# Patient Record
Sex: Female | Born: 1955 | Race: White | Hispanic: No | Marital: Married | Smoking: Never smoker
Health system: Southern US, Community
[De-identification: ages and names within clinical notes are randomized; demographics above are authoritative.]

## PROBLEM LIST (undated history)

## (undated) DIAGNOSIS — E119 Type 2 diabetes mellitus without complications: Secondary | ICD-10-CM

## (undated) DIAGNOSIS — I10 Essential (primary) hypertension: Secondary | ICD-10-CM

## (undated) DIAGNOSIS — I4891 Unspecified atrial fibrillation: Secondary | ICD-10-CM

## (undated) DIAGNOSIS — R001 Bradycardia, unspecified: Secondary | ICD-10-CM

## (undated) HISTORY — DX: Bradycardia, unspecified: R00.1

## (undated) HISTORY — DX: Type 2 diabetes mellitus without complications: E11.9

---

## 2020-09-06 ENCOUNTER — Encounter: Payer: Self-pay | Admitting: Emergency Medicine

## 2020-09-06 ENCOUNTER — Emergency Department
Admission: EM | Admit: 2020-09-06 | Discharge: 2020-09-06 | Disposition: A | Discharge status: Home / Self Care | Payer: No Typology Code available for payment source | Attending: Emergency Medicine | Admitting: Emergency Medicine

## 2020-09-06 ENCOUNTER — Emergency Department: Payer: No Typology Code available for payment source

## 2020-09-06 ENCOUNTER — Other Ambulatory Visit: Payer: Self-pay

## 2020-09-06 DIAGNOSIS — R079 Chest pain, unspecified: Secondary | ICD-10-CM | POA: Insufficient documentation

## 2020-09-06 DIAGNOSIS — M542 Cervicalgia: Secondary | ICD-10-CM | POA: Insufficient documentation

## 2020-09-06 DIAGNOSIS — S0990XA Unspecified injury of head, initial encounter: Secondary | ICD-10-CM | POA: Diagnosis present

## 2020-09-06 DIAGNOSIS — I700 Atherosclerosis of aorta: Secondary | ICD-10-CM | POA: Diagnosis not present

## 2020-09-06 DIAGNOSIS — M7531 Calcific tendinitis of right shoulder: Secondary | ICD-10-CM | POA: Diagnosis not present

## 2020-09-06 DIAGNOSIS — I10 Essential (primary) hypertension: Secondary | ICD-10-CM | POA: Insufficient documentation

## 2020-09-06 DIAGNOSIS — M25512 Pain in left shoulder: Secondary | ICD-10-CM | POA: Diagnosis present

## 2020-09-06 DIAGNOSIS — R739 Hyperglycemia, unspecified: Secondary | ICD-10-CM | POA: Insufficient documentation

## 2020-09-06 DIAGNOSIS — S3991XA Unspecified injury of abdomen, initial encounter: Secondary | ICD-10-CM | POA: Diagnosis not present

## 2020-09-06 DIAGNOSIS — Y92410 Unspecified street and highway as the place of occurrence of the external cause: Secondary | ICD-10-CM | POA: Insufficient documentation

## 2020-09-06 HISTORY — DX: Unspecified atrial fibrillation: I48.91

## 2020-09-06 HISTORY — DX: Essential (primary) hypertension: I10

## 2020-09-06 LAB — CBC
HCT: 41.6 % (ref 36.0–46.0)
Hemoglobin: 13.5 g/dL (ref 12.0–15.0)
MCH: 28.7 pg (ref 26.0–34.0)
MCHC: 32.5 g/dL (ref 30.0–36.0)
MCV: 88.5 fL (ref 80.0–100.0)
Platelets: 340 10*3/uL (ref 150–400)
RBC: 4.7 MIL/uL (ref 3.87–5.11)
RDW: 13.4 % (ref 11.5–15.5)
WBC: 10.6 10*3/uL — ABNORMAL HIGH (ref 4.0–10.5)
nRBC: 0 % (ref 0.0–0.2)

## 2020-09-06 LAB — BASIC METABOLIC PANEL
Anion gap: 11 (ref 5–15)
BUN: 25 mg/dL — ABNORMAL HIGH (ref 8–23)
CO2: 23 mmol/L (ref 22–32)
Calcium: 9.3 mg/dL (ref 8.9–10.3)
Chloride: 107 mmol/L (ref 98–111)
Creatinine, Ser: 1.08 mg/dL — ABNORMAL HIGH (ref 0.44–1.00)
GFR, Estimated: 57 mL/min — ABNORMAL LOW (ref >60)
Glucose, Bld: 172 mg/dL — ABNORMAL HIGH (ref 70–99)
Potassium: 3.7 mmol/L (ref 3.5–5.1)
Sodium: 141 mmol/L (ref 135–145)

## 2020-09-06 MED ORDER — METHOCARBAMOL 500 MG PO TABS
500.0000 mg | ORAL_TABLET | Freq: Three times a day (TID) | ORAL | 0 refills | Status: AC | PRN
Start: 2020-09-06 — End: ?

## 2020-09-06 MED ORDER — METHOCARBAMOL 500 MG PO TABS: 500.0000 mg | ORAL_TABLET | Freq: Three times a day (TID) | ORAL | 0 refills | Status: DC | PRN

## 2020-09-06 MED ORDER — IOHEXOL 300 MG/ML  SOLN
100.0000 mL | Freq: Once | INTRAMUSCULAR | Status: AC | PRN
  Administered 2020-09-06: 18:00:00 100 mL via INTRAVENOUS
  Filled 2020-09-06: qty 100

## 2020-09-06 MED ORDER — ACETAMINOPHEN 325 MG PO TABS
650.0000 mg | ORAL_TABLET | Freq: Once | ORAL | Status: AC
  Administered 2020-09-06: 19:00:00 650 mg via ORAL
  Filled 2020-09-06: qty 2

## 2020-09-06 MED ORDER — ACETAMINOPHEN 500 MG PO TABS: 500.0000 mg | ORAL_TABLET | Freq: Four times a day (QID) | ORAL | 0 refills | Status: DC | PRN

## 2020-09-06 MED ORDER — LIDOCAINE 5 % EX PTCH
1.0000 | MEDICATED_PATCH | CUTANEOUS | Status: DC
  Administered 2020-09-06: 1 via TRANSDERMAL

## 2020-09-06 MED ORDER — ACETAMINOPHEN 500 MG PO TABS: 500.0000 mg | ORAL_TABLET | Freq: Four times a day (QID) | ORAL | 0 refills | Status: AC | PRN
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
## 2020-09-06 NOTE — ED Notes (Signed)
Pt taken by xray 
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
## 2020-09-06 NOTE — ED Notes (Signed)
Skin on abdomen now appears blotchy/pink.

## 2020-09-15 DIAGNOSIS — R0600 Dyspnea, unspecified: Secondary | ICD-10-CM | POA: Diagnosis not present

## 2020-10-02 DIAGNOSIS — R0683 Snoring: Secondary | ICD-10-CM | POA: Diagnosis not present

## 2020-10-02 DIAGNOSIS — R0681 Apnea, not elsewhere classified: Secondary | ICD-10-CM | POA: Diagnosis not present

## 2020-10-02 DIAGNOSIS — E039 Hypothyroidism, unspecified: Secondary | ICD-10-CM | POA: Diagnosis not present

## 2020-10-02 DIAGNOSIS — R7303 Prediabetes: Secondary | ICD-10-CM | POA: Diagnosis not present

## 2020-10-02 DIAGNOSIS — Z780 Asymptomatic menopausal state: Secondary | ICD-10-CM | POA: Diagnosis not present

## 2020-10-02 DIAGNOSIS — I10 Essential (primary) hypertension: Secondary | ICD-10-CM | POA: Diagnosis not present

## 2020-10-02 DIAGNOSIS — Z8669 Personal history of other diseases of the nervous system and sense organs: Secondary | ICD-10-CM | POA: Diagnosis not present

## 2020-10-02 DIAGNOSIS — R739 Hyperglycemia, unspecified: Secondary | ICD-10-CM | POA: Diagnosis not present

## 2020-10-02 DIAGNOSIS — Z1211 Encounter for screening for malignant neoplasm of colon: Secondary | ICD-10-CM | POA: Diagnosis not present

## 2020-10-02 DIAGNOSIS — I480 Paroxysmal atrial fibrillation: Secondary | ICD-10-CM | POA: Diagnosis not present

## 2020-10-02 DIAGNOSIS — E538 Deficiency of other specified B group vitamins: Secondary | ICD-10-CM | POA: Diagnosis not present

## 2020-10-02 DIAGNOSIS — E559 Vitamin D deficiency, unspecified: Secondary | ICD-10-CM | POA: Diagnosis not present

## 2020-10-07 ENCOUNTER — Other Ambulatory Visit (HOSPITAL_COMMUNITY): Payer: Self-pay | Admitting: Specialist

## 2020-10-07 ENCOUNTER — Other Ambulatory Visit: Payer: Self-pay | Admitting: Specialist

## 2020-10-07 DIAGNOSIS — M1711 Unilateral primary osteoarthritis, right knee: Secondary | ICD-10-CM | POA: Diagnosis not present

## 2020-10-07 DIAGNOSIS — M7542 Impingement syndrome of left shoulder: Secondary | ICD-10-CM | POA: Diagnosis not present

## 2020-10-07 DIAGNOSIS — M542 Cervicalgia: Secondary | ICD-10-CM | POA: Diagnosis not present

## 2020-10-07 DIAGNOSIS — G4733 Obstructive sleep apnea (adult) (pediatric): Secondary | ICD-10-CM | POA: Diagnosis not present

## 2020-10-08 ENCOUNTER — Other Ambulatory Visit: Payer: Self-pay | Admitting: Specialist

## 2020-10-08 DIAGNOSIS — R0989 Other specified symptoms and signs involving the circulatory and respiratory systems: Secondary | ICD-10-CM

## 2020-10-08 DIAGNOSIS — M79604 Pain in right leg: Secondary | ICD-10-CM

## 2020-10-14 DIAGNOSIS — M25512 Pain in left shoulder: Secondary | ICD-10-CM | POA: Diagnosis not present

## 2020-10-14 DIAGNOSIS — M25561 Pain in right knee: Secondary | ICD-10-CM | POA: Diagnosis not present

## 2020-10-14 DIAGNOSIS — M542 Cervicalgia: Secondary | ICD-10-CM | POA: Diagnosis not present

## 2020-10-15 ENCOUNTER — Other Ambulatory Visit: Payer: Self-pay

## 2020-10-15 ENCOUNTER — Ambulatory Visit
Admission: RE | Admit: 2020-10-15 | Discharge: 2020-10-15 | Disposition: A | Discharge status: Home / Self Care | Payer: PPO | Source: Ambulatory Visit | Attending: Specialist | Admitting: Specialist

## 2020-10-15 DIAGNOSIS — M79605 Pain in left leg: Secondary | ICD-10-CM | POA: Insufficient documentation

## 2020-10-15 DIAGNOSIS — R0989 Other specified symptoms and signs involving the circulatory and respiratory systems: Secondary | ICD-10-CM | POA: Insufficient documentation

## 2020-10-15 DIAGNOSIS — M79604 Pain in right leg: Secondary | ICD-10-CM | POA: Diagnosis not present

## 2020-10-16 DIAGNOSIS — E039 Hypothyroidism, unspecified: Secondary | ICD-10-CM | POA: Diagnosis not present

## 2020-10-16 DIAGNOSIS — E538 Deficiency of other specified B group vitamins: Secondary | ICD-10-CM | POA: Diagnosis not present

## 2020-10-16 DIAGNOSIS — E559 Vitamin D deficiency, unspecified: Secondary | ICD-10-CM | POA: Diagnosis not present

## 2020-10-20 DIAGNOSIS — M542 Cervicalgia: Secondary | ICD-10-CM | POA: Diagnosis not present

## 2020-10-20 DIAGNOSIS — M818 Other osteoporosis without current pathological fracture: Secondary | ICD-10-CM | POA: Diagnosis not present

## 2020-10-31 DIAGNOSIS — M25512 Pain in left shoulder: Secondary | ICD-10-CM | POA: Diagnosis not present

## 2020-10-31 DIAGNOSIS — M542 Cervicalgia: Secondary | ICD-10-CM | POA: Diagnosis not present

## 2020-10-31 DIAGNOSIS — M25561 Pain in right knee: Secondary | ICD-10-CM | POA: Diagnosis not present

## 2020-11-03 DIAGNOSIS — R7303 Prediabetes: Secondary | ICD-10-CM | POA: Diagnosis not present

## 2020-11-03 DIAGNOSIS — I10 Essential (primary) hypertension: Secondary | ICD-10-CM | POA: Diagnosis not present

## 2020-11-03 DIAGNOSIS — I480 Paroxysmal atrial fibrillation: Secondary | ICD-10-CM | POA: Diagnosis not present

## 2020-11-03 DIAGNOSIS — E039 Hypothyroidism, unspecified: Secondary | ICD-10-CM | POA: Diagnosis not present

## 2020-11-05 DIAGNOSIS — M25512 Pain in left shoulder: Secondary | ICD-10-CM | POA: Diagnosis not present

## 2020-11-05 DIAGNOSIS — M542 Cervicalgia: Secondary | ICD-10-CM | POA: Diagnosis not present

## 2020-11-05 DIAGNOSIS — M25561 Pain in right knee: Secondary | ICD-10-CM | POA: Diagnosis not present

## 2020-11-12 DIAGNOSIS — M542 Cervicalgia: Secondary | ICD-10-CM | POA: Diagnosis not present

## 2020-11-12 DIAGNOSIS — M25561 Pain in right knee: Secondary | ICD-10-CM | POA: Diagnosis not present

## 2020-11-12 DIAGNOSIS — M25512 Pain in left shoulder: Secondary | ICD-10-CM | POA: Diagnosis not present

## 2020-11-14 DIAGNOSIS — H5789 Other specified disorders of eye and adnexa: Secondary | ICD-10-CM | POA: Diagnosis not present

## 2020-11-14 DIAGNOSIS — J019 Acute sinusitis, unspecified: Secondary | ICD-10-CM | POA: Diagnosis not present

## 2020-11-17 DIAGNOSIS — H1131 Conjunctival hemorrhage, right eye: Secondary | ICD-10-CM | POA: Diagnosis not present

## 2020-11-18 DIAGNOSIS — M1711 Unilateral primary osteoarthritis, right knee: Secondary | ICD-10-CM | POA: Diagnosis not present

## 2020-11-18 DIAGNOSIS — M7542 Impingement syndrome of left shoulder: Secondary | ICD-10-CM | POA: Diagnosis not present

## 2020-11-26 DIAGNOSIS — M542 Cervicalgia: Secondary | ICD-10-CM | POA: Diagnosis not present

## 2020-11-26 DIAGNOSIS — M25512 Pain in left shoulder: Secondary | ICD-10-CM | POA: Diagnosis not present

## 2020-11-26 DIAGNOSIS — M25561 Pain in right knee: Secondary | ICD-10-CM | POA: Diagnosis not present

## 2020-11-30 DIAGNOSIS — R0681 Apnea, not elsewhere classified: Secondary | ICD-10-CM | POA: Diagnosis not present

## 2020-11-30 DIAGNOSIS — G4733 Obstructive sleep apnea (adult) (pediatric): Secondary | ICD-10-CM | POA: Diagnosis not present

## 2020-12-01 DIAGNOSIS — E039 Hypothyroidism, unspecified: Secondary | ICD-10-CM | POA: Diagnosis not present

## 2020-12-16 DIAGNOSIS — M25561 Pain in right knee: Secondary | ICD-10-CM | POA: Diagnosis not present

## 2020-12-16 DIAGNOSIS — M542 Cervicalgia: Secondary | ICD-10-CM | POA: Diagnosis not present

## 2020-12-16 DIAGNOSIS — M25512 Pain in left shoulder: Secondary | ICD-10-CM | POA: Diagnosis not present

## 2021-01-02 DIAGNOSIS — Z0000 Encounter for general adult medical examination without abnormal findings: Secondary | ICD-10-CM | POA: Diagnosis not present

## 2021-01-02 DIAGNOSIS — E039 Hypothyroidism, unspecified: Secondary | ICD-10-CM | POA: Diagnosis not present

## 2021-01-02 DIAGNOSIS — Z808 Family history of malignant neoplasm of other organs or systems: Secondary | ICD-10-CM | POA: Diagnosis not present

## 2021-01-02 DIAGNOSIS — I10 Essential (primary) hypertension: Secondary | ICD-10-CM | POA: Diagnosis not present

## 2021-01-02 DIAGNOSIS — E669 Obesity, unspecified: Secondary | ICD-10-CM | POA: Diagnosis not present

## 2021-01-02 DIAGNOSIS — L710 Perioral dermatitis: Secondary | ICD-10-CM | POA: Diagnosis not present

## 2021-01-07 DIAGNOSIS — G4733 Obstructive sleep apnea (adult) (pediatric): Secondary | ICD-10-CM | POA: Diagnosis not present

## 2021-01-07 DIAGNOSIS — I480 Paroxysmal atrial fibrillation: Secondary | ICD-10-CM | POA: Diagnosis not present

## 2021-01-23 DIAGNOSIS — I480 Paroxysmal atrial fibrillation: Secondary | ICD-10-CM | POA: Diagnosis not present

## 2021-01-23 DIAGNOSIS — I10 Essential (primary) hypertension: Secondary | ICD-10-CM | POA: Diagnosis not present

## 2021-01-23 DIAGNOSIS — E039 Hypothyroidism, unspecified: Secondary | ICD-10-CM | POA: Diagnosis not present

## 2021-01-28 DIAGNOSIS — N289 Disorder of kidney and ureter, unspecified: Secondary | ICD-10-CM | POA: Diagnosis not present

## 2021-01-28 DIAGNOSIS — G4730 Sleep apnea, unspecified: Secondary | ICD-10-CM | POA: Diagnosis not present

## 2021-01-28 DIAGNOSIS — E039 Hypothyroidism, unspecified: Secondary | ICD-10-CM | POA: Diagnosis not present

## 2021-01-28 DIAGNOSIS — Z0000 Encounter for general adult medical examination without abnormal findings: Secondary | ICD-10-CM | POA: Diagnosis not present

## 2021-01-28 DIAGNOSIS — E782 Mixed hyperlipidemia: Secondary | ICD-10-CM | POA: Diagnosis not present

## 2021-01-28 DIAGNOSIS — Z1283 Encounter for screening for malignant neoplasm of skin: Secondary | ICD-10-CM | POA: Diagnosis not present

## 2021-01-28 DIAGNOSIS — R0600 Dyspnea, unspecified: Secondary | ICD-10-CM | POA: Diagnosis not present

## 2021-01-28 DIAGNOSIS — R7303 Prediabetes: Secondary | ICD-10-CM | POA: Diagnosis not present

## 2021-02-27 DIAGNOSIS — R7303 Prediabetes: Secondary | ICD-10-CM | POA: Diagnosis not present

## 2021-02-27 DIAGNOSIS — I480 Paroxysmal atrial fibrillation: Secondary | ICD-10-CM | POA: Diagnosis not present

## 2021-02-27 DIAGNOSIS — Z01818 Encounter for other preprocedural examination: Secondary | ICD-10-CM | POA: Diagnosis not present

## 2021-02-27 DIAGNOSIS — N289 Disorder of kidney and ureter, unspecified: Secondary | ICD-10-CM | POA: Diagnosis not present

## 2021-02-27 DIAGNOSIS — I10 Essential (primary) hypertension: Secondary | ICD-10-CM | POA: Diagnosis not present

## 2021-02-27 DIAGNOSIS — M7542 Impingement syndrome of left shoulder: Secondary | ICD-10-CM | POA: Diagnosis not present

## 2021-02-27 DIAGNOSIS — E039 Hypothyroidism, unspecified: Secondary | ICD-10-CM | POA: Diagnosis not present

## 2021-03-05 DIAGNOSIS — E039 Hypothyroidism, unspecified: Secondary | ICD-10-CM | POA: Diagnosis not present

## 2021-03-05 DIAGNOSIS — Z7901 Long term (current) use of anticoagulants: Secondary | ICD-10-CM | POA: Diagnosis not present

## 2021-03-05 DIAGNOSIS — K648 Other hemorrhoids: Secondary | ICD-10-CM | POA: Diagnosis not present

## 2021-03-05 DIAGNOSIS — Z1211 Encounter for screening for malignant neoplasm of colon: Secondary | ICD-10-CM | POA: Diagnosis not present

## 2021-03-05 DIAGNOSIS — D122 Benign neoplasm of ascending colon: Secondary | ICD-10-CM | POA: Diagnosis not present

## 2021-03-05 DIAGNOSIS — K644 Residual hemorrhoidal skin tags: Secondary | ICD-10-CM | POA: Diagnosis not present

## 2021-03-05 DIAGNOSIS — Z800 Family history of malignant neoplasm of digestive organs: Secondary | ICD-10-CM | POA: Diagnosis not present

## 2021-03-05 DIAGNOSIS — Z79890 Hormone replacement therapy (postmenopausal): Secondary | ICD-10-CM | POA: Diagnosis not present

## 2021-03-05 DIAGNOSIS — D124 Benign neoplasm of descending colon: Secondary | ICD-10-CM | POA: Diagnosis not present

## 2021-03-05 DIAGNOSIS — I480 Paroxysmal atrial fibrillation: Secondary | ICD-10-CM | POA: Diagnosis not present

## 2021-03-05 DIAGNOSIS — I10 Essential (primary) hypertension: Secondary | ICD-10-CM | POA: Diagnosis not present

## 2021-03-05 DIAGNOSIS — Z86010 Personal history of colonic polyps: Secondary | ICD-10-CM | POA: Diagnosis not present

## 2021-03-05 DIAGNOSIS — Z79899 Other long term (current) drug therapy: Secondary | ICD-10-CM | POA: Diagnosis not present

## 2021-03-05 DIAGNOSIS — G4730 Sleep apnea, unspecified: Secondary | ICD-10-CM | POA: Diagnosis not present

## 2021-03-05 DIAGNOSIS — M170 Bilateral primary osteoarthritis of knee: Secondary | ICD-10-CM | POA: Diagnosis not present

## 2021-03-05 DIAGNOSIS — E782 Mixed hyperlipidemia: Secondary | ICD-10-CM | POA: Diagnosis not present

## 2021-04-14 DIAGNOSIS — L821 Other seborrheic keratosis: Secondary | ICD-10-CM | POA: Diagnosis not present

## 2021-04-14 DIAGNOSIS — L570 Actinic keratosis: Secondary | ICD-10-CM | POA: Diagnosis not present

## 2021-04-14 DIAGNOSIS — L578 Other skin changes due to chronic exposure to nonionizing radiation: Secondary | ICD-10-CM | POA: Diagnosis not present

## 2021-04-14 DIAGNOSIS — D225 Melanocytic nevi of trunk: Secondary | ICD-10-CM | POA: Diagnosis not present

## 2021-04-14 DIAGNOSIS — L718 Other rosacea: Secondary | ICD-10-CM | POA: Diagnosis not present

## 2021-04-20 DIAGNOSIS — B029 Zoster without complications: Secondary | ICD-10-CM | POA: Diagnosis not present

## 2021-04-21 DIAGNOSIS — B0230 Zoster ocular disease, unspecified: Secondary | ICD-10-CM | POA: Diagnosis not present

## 2021-07-17 DIAGNOSIS — R7303 Prediabetes: Secondary | ICD-10-CM | POA: Diagnosis not present

## 2021-07-17 DIAGNOSIS — E782 Mixed hyperlipidemia: Secondary | ICD-10-CM | POA: Diagnosis not present

## 2021-07-17 DIAGNOSIS — N289 Disorder of kidney and ureter, unspecified: Secondary | ICD-10-CM | POA: Diagnosis not present

## 2021-07-17 DIAGNOSIS — E039 Hypothyroidism, unspecified: Secondary | ICD-10-CM | POA: Diagnosis not present

## 2021-07-20 DIAGNOSIS — R7303 Prediabetes: Secondary | ICD-10-CM | POA: Diagnosis not present

## 2021-07-20 DIAGNOSIS — E039 Hypothyroidism, unspecified: Secondary | ICD-10-CM | POA: Diagnosis not present

## 2021-07-20 DIAGNOSIS — I10 Essential (primary) hypertension: Secondary | ICD-10-CM | POA: Diagnosis not present

## 2021-07-20 DIAGNOSIS — I480 Paroxysmal atrial fibrillation: Secondary | ICD-10-CM | POA: Diagnosis not present

## 2021-07-20 DIAGNOSIS — N1831 Chronic kidney disease, stage 3a: Secondary | ICD-10-CM | POA: Diagnosis not present

## 2021-07-20 DIAGNOSIS — E782 Mixed hyperlipidemia: Secondary | ICD-10-CM | POA: Diagnosis not present

## 2021-08-17 DIAGNOSIS — E039 Hypothyroidism, unspecified: Secondary | ICD-10-CM | POA: Diagnosis not present

## 2021-08-26 DIAGNOSIS — R0609 Other forms of dyspnea: Secondary | ICD-10-CM | POA: Diagnosis not present

## 2021-08-26 DIAGNOSIS — R058 Other specified cough: Secondary | ICD-10-CM | POA: Diagnosis not present

## 2021-08-26 DIAGNOSIS — G4733 Obstructive sleep apnea (adult) (pediatric): Secondary | ICD-10-CM | POA: Diagnosis not present

## 2021-08-26 DIAGNOSIS — Z01818 Encounter for other preprocedural examination: Secondary | ICD-10-CM | POA: Diagnosis not present

## 2021-10-20 DIAGNOSIS — R7303 Prediabetes: Secondary | ICD-10-CM | POA: Diagnosis not present

## 2021-10-20 DIAGNOSIS — E782 Mixed hyperlipidemia: Secondary | ICD-10-CM | POA: Diagnosis not present

## 2021-10-27 DIAGNOSIS — I480 Paroxysmal atrial fibrillation: Secondary | ICD-10-CM | POA: Diagnosis not present

## 2021-10-27 DIAGNOSIS — E782 Mixed hyperlipidemia: Secondary | ICD-10-CM | POA: Diagnosis not present

## 2021-10-27 DIAGNOSIS — I10 Essential (primary) hypertension: Secondary | ICD-10-CM | POA: Diagnosis not present

## 2021-10-27 DIAGNOSIS — N1831 Chronic kidney disease, stage 3a: Secondary | ICD-10-CM | POA: Diagnosis not present

## 2021-10-27 DIAGNOSIS — E039 Hypothyroidism, unspecified: Secondary | ICD-10-CM | POA: Diagnosis not present

## 2021-10-27 DIAGNOSIS — R7303 Prediabetes: Secondary | ICD-10-CM | POA: Diagnosis not present

## 2021-11-05 DIAGNOSIS — N950 Postmenopausal bleeding: Secondary | ICD-10-CM | POA: Diagnosis not present

## 2021-11-05 DIAGNOSIS — Z0000 Encounter for general adult medical examination without abnormal findings: Secondary | ICD-10-CM | POA: Diagnosis not present

## 2021-11-05 DIAGNOSIS — Z01411 Encounter for gynecological examination (general) (routine) with abnormal findings: Secondary | ICD-10-CM | POA: Diagnosis not present

## 2021-11-05 DIAGNOSIS — Z1231 Encounter for screening mammogram for malignant neoplasm of breast: Secondary | ICD-10-CM | POA: Diagnosis not present

## 2021-11-05 DIAGNOSIS — N952 Postmenopausal atrophic vaginitis: Secondary | ICD-10-CM | POA: Diagnosis not present

## 2021-11-05 DIAGNOSIS — Z124 Encounter for screening for malignant neoplasm of cervix: Secondary | ICD-10-CM | POA: Diagnosis not present

## 2022-01-18 DIAGNOSIS — R7303 Prediabetes: Secondary | ICD-10-CM | POA: Diagnosis not present

## 2022-01-18 DIAGNOSIS — E782 Mixed hyperlipidemia: Secondary | ICD-10-CM | POA: Diagnosis not present

## 2022-01-18 DIAGNOSIS — E039 Hypothyroidism, unspecified: Secondary | ICD-10-CM | POA: Diagnosis not present

## 2022-01-25 DIAGNOSIS — E039 Hypothyroidism, unspecified: Secondary | ICD-10-CM | POA: Diagnosis not present

## 2022-01-25 DIAGNOSIS — E1169 Type 2 diabetes mellitus with other specified complication: Secondary | ICD-10-CM | POA: Diagnosis not present

## 2022-01-25 DIAGNOSIS — I480 Paroxysmal atrial fibrillation: Secondary | ICD-10-CM | POA: Diagnosis not present

## 2022-01-25 DIAGNOSIS — E782 Mixed hyperlipidemia: Secondary | ICD-10-CM | POA: Diagnosis not present

## 2022-01-25 DIAGNOSIS — I10 Essential (primary) hypertension: Secondary | ICD-10-CM | POA: Diagnosis not present

## 2022-01-25 DIAGNOSIS — N1831 Chronic kidney disease, stage 3a: Secondary | ICD-10-CM | POA: Diagnosis not present

## 2022-01-25 DIAGNOSIS — E785 Hyperlipidemia, unspecified: Secondary | ICD-10-CM | POA: Diagnosis not present

## 2022-02-03 DIAGNOSIS — S1096XA Insect bite of unspecified part of neck, initial encounter: Secondary | ICD-10-CM | POA: Diagnosis not present

## 2022-02-03 DIAGNOSIS — Z23 Encounter for immunization: Secondary | ICD-10-CM | POA: Diagnosis not present

## 2022-02-03 DIAGNOSIS — W57XXXA Bitten or stung by nonvenomous insect and other nonvenomous arthropods, initial encounter: Secondary | ICD-10-CM | POA: Diagnosis not present

## 2022-02-03 DIAGNOSIS — R21 Rash and other nonspecific skin eruption: Secondary | ICD-10-CM | POA: Diagnosis not present

## 2022-02-09 ENCOUNTER — Emergency Department: Payer: PPO

## 2022-02-09 ENCOUNTER — Other Ambulatory Visit: Payer: Self-pay

## 2022-02-09 DIAGNOSIS — R0789 Other chest pain: Secondary | ICD-10-CM | POA: Diagnosis not present

## 2022-02-09 DIAGNOSIS — I10 Essential (primary) hypertension: Secondary | ICD-10-CM | POA: Diagnosis not present

## 2022-02-09 DIAGNOSIS — Z7901 Long term (current) use of anticoagulants: Secondary | ICD-10-CM | POA: Diagnosis not present

## 2022-02-09 DIAGNOSIS — M4184 Other forms of scoliosis, thoracic region: Secondary | ICD-10-CM | POA: Diagnosis not present

## 2022-02-09 DIAGNOSIS — E039 Hypothyroidism, unspecified: Secondary | ICD-10-CM | POA: Insufficient documentation

## 2022-02-09 DIAGNOSIS — K449 Diaphragmatic hernia without obstruction or gangrene: Secondary | ICD-10-CM | POA: Diagnosis not present

## 2022-02-09 DIAGNOSIS — I2510 Atherosclerotic heart disease of native coronary artery without angina pectoris: Secondary | ICD-10-CM | POA: Diagnosis not present

## 2022-02-09 DIAGNOSIS — R0602 Shortness of breath: Secondary | ICD-10-CM | POA: Insufficient documentation

## 2022-02-09 DIAGNOSIS — J984 Other disorders of lung: Secondary | ICD-10-CM | POA: Diagnosis not present

## 2022-02-09 DIAGNOSIS — R072 Precordial pain: Secondary | ICD-10-CM | POA: Diagnosis not present

## 2022-02-09 DIAGNOSIS — R202 Paresthesia of skin: Secondary | ICD-10-CM | POA: Insufficient documentation

## 2022-02-09 DIAGNOSIS — R079 Chest pain, unspecified: Secondary | ICD-10-CM | POA: Diagnosis not present

## 2022-02-09 LAB — BASIC METABOLIC PANEL
Anion gap: 9 (ref 5–15)
BUN: 18 mg/dL (ref 8–23)
CO2: 22 mmol/L (ref 22–32)
Calcium: 9.3 mg/dL (ref 8.9–10.3)
Chloride: 106 mmol/L (ref 98–111)
Creatinine, Ser: 1.2 mg/dL — ABNORMAL HIGH (ref 0.44–1.00)
GFR, Estimated: 50 mL/min — ABNORMAL LOW (ref >60)
Glucose, Bld: 114 mg/dL — ABNORMAL HIGH (ref 70–99)
Potassium: 3.2 mmol/L — ABNORMAL LOW (ref 3.5–5.1)
Sodium: 137 mmol/L (ref 135–145)

## 2022-02-09 LAB — CBC
HCT: 41.2 % (ref 36.0–46.0)
Hemoglobin: 13.1 g/dL (ref 12.0–15.0)
MCH: 27.9 pg (ref 26.0–34.0)
MCHC: 31.8 g/dL (ref 30.0–36.0)
MCV: 87.7 fL (ref 80.0–100.0)
Platelets: 324 10*3/uL (ref 150–400)
RBC: 4.7 MIL/uL (ref 3.87–5.11)
RDW: 13.2 % (ref 11.5–15.5)
WBC: 10 10*3/uL (ref 4.0–10.5)
nRBC: 0 % (ref 0.0–0.2)

## 2022-02-09 LAB — TROPONIN I (HIGH SENSITIVITY): Troponin I (High Sensitivity): 5 ng/L (ref <18)

## 2022-02-09 NOTE — ED Triage Notes (Signed)
Pt arrives with c/o chest pain that has happened intermittently over the day. Per pt, pain has radiated to her neck at times. Pt has hx of a.fib. Pt takes eliquis. Pt endorses SOB.

## 2022-02-09 NOTE — ED Provider Triage Note (Signed)
Emergency Medicine Provider Triage Evaluation Note  Abigail Harris , a 66 y.o. female  was evaluated in triage.  Pt complains of left side chest pain with radiation into left neck. Symptoms started yesterday, but did not radiate and went away quickly. Today, pain comes and goes but has not stayed gone.  Review of Systems  Positive: Chest pain, shortness of breath Negative: Fever, cough  Physical Exam  There were no vitals taken for this visit. Gen:   Awake, no distress   Resp:  Normal effort  MSK:   Moves extremities without difficulty  Other:    Medical Decision Making  Medically screening exam initiated at 10:15 PM.  Appropriate orders placed.  Abigail Harris was informed that the remainder of the evaluation will be completed by another provider, this initial triage assessment does not replace that evaluation, and the importance of remaining in the ED until their evaluation is complete.   Victorino Dike, FNP 02/09/22 2218

## 2022-02-10 ENCOUNTER — Emergency Department: Payer: PPO

## 2022-02-10 ENCOUNTER — Emergency Department
Admission: EM | Admit: 2022-02-10 | Discharge: 2022-02-10 | Disposition: A | Discharge status: Home / Self Care | Payer: PPO | Attending: Emergency Medicine | Admitting: Emergency Medicine

## 2022-02-10 DIAGNOSIS — M4184 Other forms of scoliosis, thoracic region: Secondary | ICD-10-CM | POA: Diagnosis not present

## 2022-02-10 DIAGNOSIS — R0789 Other chest pain: Secondary | ICD-10-CM

## 2022-02-10 DIAGNOSIS — I2510 Atherosclerotic heart disease of native coronary artery without angina pectoris: Secondary | ICD-10-CM | POA: Diagnosis not present

## 2022-02-10 DIAGNOSIS — K449 Diaphragmatic hernia without obstruction or gangrene: Secondary | ICD-10-CM | POA: Diagnosis not present

## 2022-02-10 DIAGNOSIS — J984 Other disorders of lung: Secondary | ICD-10-CM | POA: Diagnosis not present

## 2022-02-10 LAB — TROPONIN I (HIGH SENSITIVITY): Troponin I (High Sensitivity): 6 ng/L (ref <18)

## 2022-02-10 MED ORDER — IOHEXOL 350 MG/ML SOLN
100.0000 mL | Freq: Once | INTRAVENOUS | Status: AC | PRN
  Administered 2022-02-10: 100 mL via INTRAVENOUS

## 2022-02-10 NOTE — ED Notes (Signed)
Patient verbalized discharge understanding  

## 2022-02-10 NOTE — ED Notes (Signed)
Pt states chest pain and tingling on left side of underarm

## 2022-02-10 NOTE — Discharge Instructions (Signed)
As we discussed, your CT showed some incidental findings including a nodule on the right and a nodule on the left lung.  You will need a repeat CT in 6 to 12 months.  Discussed that with your primary care doctor.  It also showed cirrhosis of the liver.  You need to discuss that with your doctor for further evaluation.  For the chest pain that you may do heat pads and Tylenol.  Follow-up with the cardiologist as has been recommended by your doctor.  Return to the ER for new or worsening chest pain or shortness of breath
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
## 2022-02-15 DIAGNOSIS — Z87898 Personal history of other specified conditions: Secondary | ICD-10-CM | POA: Diagnosis not present

## 2022-02-15 DIAGNOSIS — R0602 Shortness of breath: Secondary | ICD-10-CM | POA: Diagnosis not present

## 2022-02-15 DIAGNOSIS — K7469 Other cirrhosis of liver: Secondary | ICD-10-CM | POA: Diagnosis not present

## 2022-02-15 DIAGNOSIS — R911 Solitary pulmonary nodule: Secondary | ICD-10-CM | POA: Diagnosis not present

## 2022-03-05 DIAGNOSIS — R0602 Shortness of breath: Secondary | ICD-10-CM | POA: Diagnosis not present

## 2022-03-05 DIAGNOSIS — J4540 Moderate persistent asthma, uncomplicated: Secondary | ICD-10-CM | POA: Diagnosis not present

## 2022-03-31 ENCOUNTER — Ambulatory Visit: Payer: PPO | Admitting: Cardiovascular Disease

## 2022-03-31 ENCOUNTER — Encounter: Payer: Self-pay | Admitting: Cardiovascular Disease

## 2022-03-31 VITALS — BP 136/90 | HR 58 | Ht 64.0 in | Wt 214.0 lb

## 2022-03-31 DIAGNOSIS — E782 Mixed hyperlipidemia: Secondary | ICD-10-CM | POA: Diagnosis not present

## 2022-03-31 DIAGNOSIS — I10 Essential (primary) hypertension: Secondary | ICD-10-CM | POA: Diagnosis not present

## 2022-03-31 DIAGNOSIS — R0602 Shortness of breath: Secondary | ICD-10-CM | POA: Diagnosis not present

## 2022-03-31 DIAGNOSIS — G4733 Obstructive sleep apnea (adult) (pediatric): Secondary | ICD-10-CM

## 2022-03-31 DIAGNOSIS — I480 Paroxysmal atrial fibrillation: Secondary | ICD-10-CM

## 2022-03-31 MED ORDER — PROPRANOLOL HCL 20 MG PO TABS: 20.0000 mg | ORAL_TABLET | Freq: Every day | ORAL | 3 refills | Status: AC | PRN

## 2022-03-31 NOTE — Patient Instructions (Addendum)
Medication Instructions:  Propanolol 20 mg daily as needed for atrial fib spells  Med refills  If you need a refill on your cardiac medications before your next appointment, please call your pharmacy.   Lab work: No new labs needed  Testing/Procedures: No new testing needed  Follow-Up: At [facility], you and your health needs are our priority.  As part of our continuing mission to provide you with exceptional heart care, we have created designated Provider Care Teams.  These Care Teams include your primary Cardiologist (physician) and Advanced Practice Providers (APPs -  Physician Assistants and Nurse Practitioners) who all work together to provide you with the care you need, when you need it.  You will need a follow up appointment in 12 months  Providers on your designated Care Team:   Murray Hodgkins, NP Christell Faith, PA-C Cadence Kathlen Mody, [place]  COVID-19 Vaccine Information can be found at: [email or web address] For questions related to vaccine distribution or appointments, please email [email or web address] or call [phone].

## 2022-03-31 NOTE — Progress Notes (Signed)
Cardiology Office Note  Date:  03/31/2022   ID:  Abigail, Harris 10/15/1955, MRN [id number]  PCP:  Dion Body, MD   Chief Complaint  Patient presents with   New Patient (Initial Visit)    Ref by Dr. Netty Starring to establish care for A-Fib & bradycardia. Medications reviewed by the patient verbally.     HPI:  Abigail Harris is a 66 year old woman with past medical history of Atrial fibrillation, initial dx in 2013 in [place] Bradycardia on meds Hypertension  Hypothyroidism who presents by referral from the emergency room for consultation of her atypical chest pain Asthma Mild to moderate OSA, non on CPAP Previously followed by cardiology at [facility] last seen September 2021 Who presents by referral from Dr. Netty Starring for consultation of her paroxysmal atrial fibrillation   Presenting to the emergency room Feb 10, 2022 with chest pain  Afib in 2013, in hospital Used to be on atenolol: This was held for unclear reasons Several episodes since then  Last episode was 1 year ago  Atrial fibrillation episodes lasting between 5 to 45 min   Feels BP well controlled Has f/u with pulmonary for SOB, Has not been able to get trilogy  Lab work reviewed A1C 6.7 Total chol 174, LDL 103  Echo 2021: NORMAL LEFT VENTRICULAR SYSTOLIC FUNCTION  NORMAL RIGHT VENTRICULAR SYSTOLIC FUNCTION  TRIVIAL REGURGITATION NOTED (See above)  NO VALVULAR STENOSIS  POOR SOUND TRANSMISSION  DEFINITY USED TO ENHANCE ENDOCARDIAL BORDERS    PMH:   has a past medical history of A-fib ([place]), Bradycardia, Hypertension, and Type II diabetes mellitus ([place]).  PSH:    Past Surgical History:  Procedure Laterality Date   CESAREAN SECTION      Current Outpatient Medications  Medication Sig Dispense Refill   acetaminophen (TYLENOL) 500 MG tablet Take 1 tablet (500 mg total) by mouth every 6 (six) hours as needed. 30 tablet 0   Cholecalciferol 25 MCG (1000 UT) capsule Take 1,000  Units by mouth daily.     ELIQUIS 5 MG TABS tablet Take 5 mg by mouth 2 (two) times daily.     ezetimibe (ZETIA) 10 MG tablet Take 10 mg by mouth daily.     levothyroxine (SYNTHROID) 112 MCG tablet Take 112 mcg by mouth 2 (two) times a week. Saturday & Sunday     levothyroxine (SYNTHROID) 125 MCG tablet Take 125 mcg by mouth daily before breakfast. Monday - Friday     losartan-hydrochlorothiazide (HYZAAR) 100-12.5 MG tablet Take 1 tablet by mouth daily.     propranolol (INDERAL) 20 MG tablet Take 1 tablet (20 mg total) by mouth daily as needed (a fib). 30 tablet 3   vitamin B-12 (CYANOCOBALAMIN) 1000 MCG tablet Take 1,000 mcg by mouth daily.     metFORMIN (GLUCOPHAGE-XR) 500 MG 24 hr tablet Take 500 mg by mouth daily. (Patient not taking: Reported on 03/31/2022)     methocarbamol (ROBAXIN) 500 MG tablet Take 1 tablet (500 mg total) by mouth every 8 (eight) hours as needed for muscle spasms. (Patient not taking: Reported on 03/31/2022) 15 tablet 0   pantoprazole (PROTONIX) 20 MG tablet Take 20 mg by mouth daily. (Patient not taking: Reported on 03/31/2022)     TRELEGY ELLIPTA 200-62.5-25 MCG/ACT AEPB Inhale 1 puff into the lungs daily. (Patient not taking: Reported on 03/31/2022)     No current facility-administered medications for this visit.    Allergies:   Lovastatin, Milk (cow), and Robaxin [methocarbamol]   Social History:  The patient  reports that she has never smoked. She has never used smokeless tobacco. She reports current alcohol use. She reports that she does not use drugs.   Family History:   family history includes AAA (abdominal aortic aneurysm) in her brother; Arrhythmia in her mother; Cancer in her father and maternal grandfather; Diabetes type II in her brother; Heart attack in her maternal grandmother; Heart attack (age of onset: 64) in her father; Heart attack (age of onset: 66) in her brother; Hyperlipidemia in her mother; Hypertension in her mother.    Review of  Systems: Review of Systems  Constitutional: Negative.   HENT: Negative.    Respiratory: Negative.    Cardiovascular: Negative.   Gastrointestinal: Negative.   Musculoskeletal: Negative.   Neurological: Negative.   Psychiatric/Behavioral: Negative.    All other systems reviewed and are negative.   PHYSICAL EXAM: VS:  BP 136/90 (BP Location: Right Arm, Patient Position: Sitting, Cuff Size: Normal)   Pulse (!) 58   Ht '5\' 4"'$  (1.626 m)   Wt 214 lb (97.1 kg)   SpO2 98%   BMI 36.73 kg/m  , BMI Body mass index is 36.73 kg/m. GEN: Well nourished, well developed, in no acute distress HEENT: normal Neck: no JVD, carotid bruits, or masses Cardiac: RRR; no murmurs, rubs, or gallops,no edema  Respiratory:  clear to auscultation bilaterally, normal work of breathing GI: soft, nontender, nondistended, + BS MS: no deformity or atrophy Skin: warm and dry, no rash Neuro:  Strength and sensation are intact Psych: euthymic mood, full affect   Recent Labs: 02/09/2022: BUN 18; Creatinine, Ser 1.20; Hemoglobin 13.1; Platelets 324; Potassium 3.2; Sodium 137    Lipid Panel No results found for: "CHOL", "HDL", "LDLCALC", "TRIG"    Wt Readings from Last 3 Encounters:  03/31/22 214 lb (97.1 kg)  02/09/22 211 lb (95.7 kg)  09/06/20 193 lb (87.5 kg)      ASSESSMENT AND PLAN:  Problem List Items Addressed This Visit   None Visit Diagnoses     Paroxysmal atrial fibrillation (HCC)    -  Primary   Relevant Medications   ELIQUIS 5 MG TABS tablet   ezetimibe (ZETIA) 10 MG tablet   losartan-hydrochlorothiazide (HYZAAR) 100-12.5 MG tablet   propranolol (INDERAL) 20 MG tablet   Other Relevant Orders   EKG 12-Lead      Paroxysmal atrial fibrillation Episodes by report dating back 10 years, rare,  lasting 5 to 45 minutes Has not been on rate or rhythm control agents recently secondary to beta-blockers We have recommended we prescribe propranolol 10 to 20 mg as needed as needed for  breakthrough atrial fibrillation episodes Continue Eliquis 5 twice daily Sleep apnea likely a trigger, did not tolerate CPAP  Essential hypertension Blood pressure well controlled on losartan HCTZ  Hyperlipidemia On Zetia, statin intolerance  Shortness of breath Reports history of asthma, currently not on Trelegy Has follow-up with pulmonary Consider lung work/pulmonary rehab program for conditioning   Total encounter time more than 60 minutes  Greater than 50% was spent in counseling and coordination of care with the patient    Signed, Esmond Plants, M.D., Ph.D. [place], [place]

## 2022-04-01 DIAGNOSIS — G4733 Obstructive sleep apnea (adult) (pediatric): Secondary | ICD-10-CM | POA: Diagnosis not present
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
## 2022-04-12 ENCOUNTER — Other Ambulatory Visit: Payer: Self-pay | Admitting: Student

## 2022-04-12 ENCOUNTER — Ambulatory Visit
Admission: RE | Admit: 2022-04-12 | Discharge: 2022-04-12 | Disposition: A | Discharge status: Home / Self Care | Payer: PPO | Source: Ambulatory Visit | Attending: Student | Admitting: Student

## 2022-04-12 DIAGNOSIS — R1013 Epigastric pain: Secondary | ICD-10-CM | POA: Diagnosis not present

## 2022-04-12 DIAGNOSIS — R109 Unspecified abdominal pain: Secondary | ICD-10-CM | POA: Diagnosis not present

## 2022-04-12 DIAGNOSIS — Z87898 Personal history of other specified conditions: Secondary | ICD-10-CM | POA: Diagnosis not present

## 2022-04-12 DIAGNOSIS — R030 Elevated blood-pressure reading, without diagnosis of hypertension: Secondary | ICD-10-CM | POA: Diagnosis not present

## 2022-04-12 DIAGNOSIS — R2232 Localized swelling, mass and lump, left upper limb: Secondary | ICD-10-CM | POA: Insufficient documentation

## 2022-04-12 DIAGNOSIS — R222 Localized swelling, mass and lump, trunk: Secondary | ICD-10-CM

## 2022-04-12 DIAGNOSIS — Z0389 Encounter for observation for other suspected diseases and conditions ruled out: Secondary | ICD-10-CM | POA: Diagnosis not present

## 2022-04-12 DIAGNOSIS — R221 Localized swelling, mass and lump, neck: Secondary | ICD-10-CM | POA: Diagnosis not present

## 2022-04-21 DIAGNOSIS — U071 COVID-19: Secondary | ICD-10-CM | POA: Diagnosis not present

## 2022-04-21 DIAGNOSIS — J209 Acute bronchitis, unspecified: Secondary | ICD-10-CM | POA: Diagnosis not present

## 2022-04-21 DIAGNOSIS — B9689 Other specified bacterial agents as the cause of diseases classified elsewhere: Secondary | ICD-10-CM | POA: Diagnosis not present

## 2022-04-21 DIAGNOSIS — J019 Acute sinusitis, unspecified: Secondary | ICD-10-CM | POA: Diagnosis not present

## 2022-04-21 DIAGNOSIS — Z03818 Encounter for observation for suspected exposure to other biological agents ruled out: Secondary | ICD-10-CM | POA: Diagnosis not present

## 2022-05-18 DIAGNOSIS — R590 Localized enlarged lymph nodes: Secondary | ICD-10-CM | POA: Diagnosis not present

## 2022-05-19 ENCOUNTER — Other Ambulatory Visit: Payer: Self-pay | Admitting: Pulmonary Disease

## 2022-05-19 ENCOUNTER — Ambulatory Visit
Admission: RE | Admit: 2022-05-19 | Discharge: 2022-05-19 | Disposition: A | Discharge status: Home / Self Care | Payer: PPO | Source: Ambulatory Visit | Attending: Pulmonary Disease | Admitting: Pulmonary Disease

## 2022-05-19 DIAGNOSIS — R7989 Other specified abnormal findings of blood chemistry: Secondary | ICD-10-CM | POA: Diagnosis not present

## 2022-05-19 DIAGNOSIS — R590 Localized enlarged lymph nodes: Secondary | ICD-10-CM | POA: Insufficient documentation

## 2022-05-19 LAB — POCT I-STAT CREATININE: Creatinine, Ser: 1.1 mg/dL — ABNORMAL HIGH (ref 0.44–1.00)

## 2022-05-19 MED ORDER — IOHEXOL 350 MG/ML SOLN
75.0000 mL | Freq: Once | INTRAVENOUS | Status: AC | PRN
  Administered 2022-05-19: 75 mL via INTRAVENOUS

## 2022-05-23 DIAGNOSIS — G4733 Obstructive sleep apnea (adult) (pediatric): Secondary | ICD-10-CM | POA: Diagnosis not present

## 2022-06-01 ENCOUNTER — Other Ambulatory Visit: Payer: Self-pay | Admitting: Gastroenterology

## 2022-06-01 DIAGNOSIS — E785 Hyperlipidemia, unspecified: Secondary | ICD-10-CM | POA: Diagnosis not present

## 2022-06-01 DIAGNOSIS — E1169 Type 2 diabetes mellitus with other specified complication: Secondary | ICD-10-CM | POA: Diagnosis not present

## 2022-06-01 DIAGNOSIS — K760 Fatty (change of) liver, not elsewhere classified: Secondary | ICD-10-CM | POA: Diagnosis not present

## 2022-06-01 DIAGNOSIS — K7460 Unspecified cirrhosis of liver: Secondary | ICD-10-CM

## 2022-06-01 DIAGNOSIS — K219 Gastro-esophageal reflux disease without esophagitis: Secondary | ICD-10-CM | POA: Diagnosis not present

## 2022-06-01 DIAGNOSIS — K449 Diaphragmatic hernia without obstruction or gangrene: Secondary | ICD-10-CM | POA: Diagnosis not present

## 2022-06-02 DIAGNOSIS — L570 Actinic keratosis: Secondary | ICD-10-CM | POA: Diagnosis not present

## 2022-06-02 DIAGNOSIS — L578 Other skin changes due to chronic exposure to nonionizing radiation: Secondary | ICD-10-CM | POA: Diagnosis not present

## 2022-06-02 DIAGNOSIS — L821 Other seborrheic keratosis: Secondary | ICD-10-CM | POA: Diagnosis not present

## 2022-06-02 DIAGNOSIS — L718 Other rosacea: Secondary | ICD-10-CM | POA: Diagnosis not present

## 2022-06-02 DIAGNOSIS — D225 Melanocytic nevi of trunk: Secondary | ICD-10-CM | POA: Diagnosis not present

## 2022-06-04 ENCOUNTER — Ambulatory Visit
Admission: RE | Admit: 2022-06-04 | Discharge: 2022-06-04 | Disposition: A | Discharge status: Home / Self Care | Payer: PPO | Source: Ambulatory Visit | Attending: Gastroenterology | Admitting: Gastroenterology

## 2022-06-04 DIAGNOSIS — K760 Fatty (change of) liver, not elsewhere classified: Secondary | ICD-10-CM | POA: Diagnosis not present

## 2022-06-04 DIAGNOSIS — K7460 Unspecified cirrhosis of liver: Secondary | ICD-10-CM | POA: Diagnosis not present

## 2022-06-07 ENCOUNTER — Other Ambulatory Visit: Payer: Self-pay | Admitting: Gastroenterology

## 2022-06-07 ENCOUNTER — Other Ambulatory Visit (HOSPITAL_COMMUNITY): Payer: Self-pay | Admitting: Gastroenterology

## 2022-06-07 DIAGNOSIS — I10 Essential (primary) hypertension: Secondary | ICD-10-CM | POA: Diagnosis not present

## 2022-06-07 DIAGNOSIS — E782 Mixed hyperlipidemia: Secondary | ICD-10-CM | POA: Diagnosis not present

## 2022-06-07 DIAGNOSIS — R1310 Dysphagia, unspecified: Secondary | ICD-10-CM

## 2022-06-07 DIAGNOSIS — R1314 Dysphagia, pharyngoesophageal phase: Secondary | ICD-10-CM

## 2022-06-07 DIAGNOSIS — E1169 Type 2 diabetes mellitus with other specified complication: Secondary | ICD-10-CM | POA: Diagnosis not present

## 2022-06-07 DIAGNOSIS — E785 Hyperlipidemia, unspecified: Secondary | ICD-10-CM | POA: Diagnosis not present

## 2022-06-14 ENCOUNTER — Ambulatory Visit
Admission: RE | Admit: 2022-06-14 | Discharge: 2022-06-14 | Disposition: A | Discharge status: Home / Self Care | Payer: PPO | Source: Ambulatory Visit | Attending: Gastroenterology | Admitting: Gastroenterology

## 2022-06-14 ENCOUNTER — Encounter (HOSPITAL_COMMUNITY): Payer: Self-pay | Admitting: Radiology

## 2022-06-14 DIAGNOSIS — R1310 Dysphagia, unspecified: Secondary | ICD-10-CM | POA: Insufficient documentation

## 2022-06-14 DIAGNOSIS — R1314 Dysphagia, pharyngoesophageal phase: Secondary | ICD-10-CM | POA: Insufficient documentation

## 2022-06-14 DIAGNOSIS — K7460 Unspecified cirrhosis of liver: Secondary | ICD-10-CM | POA: Diagnosis not present

## 2022-06-15 DIAGNOSIS — E1169 Type 2 diabetes mellitus with other specified complication: Secondary | ICD-10-CM | POA: Diagnosis not present

## 2022-06-15 DIAGNOSIS — E785 Hyperlipidemia, unspecified: Secondary | ICD-10-CM | POA: Diagnosis not present

## 2022-06-15 DIAGNOSIS — Z0000 Encounter for general adult medical examination without abnormal findings: Secondary | ICD-10-CM | POA: Diagnosis not present

## 2022-06-15 DIAGNOSIS — E782 Mixed hyperlipidemia: Secondary | ICD-10-CM | POA: Diagnosis not present

## 2022-06-15 DIAGNOSIS — I10 Essential (primary) hypertension: Secondary | ICD-10-CM | POA: Diagnosis not present

## 2022-06-15 DIAGNOSIS — N1831 Chronic kidney disease, stage 3a: Secondary | ICD-10-CM | POA: Diagnosis not present

## 2022-08-12 DIAGNOSIS — M7061 Trochanteric bursitis, right hip: Secondary | ICD-10-CM | POA: Diagnosis not present

## 2022-08-12 DIAGNOSIS — M722 Plantar fascial fibromatosis: Secondary | ICD-10-CM | POA: Diagnosis not present

## 2022-08-13 DIAGNOSIS — M542 Cervicalgia: Secondary | ICD-10-CM | POA: Diagnosis not present

## 2022-08-17 DIAGNOSIS — M19041 Primary osteoarthritis, right hand: Secondary | ICD-10-CM | POA: Diagnosis not present

## 2022-08-17 DIAGNOSIS — M19049 Primary osteoarthritis, unspecified hand: Secondary | ICD-10-CM | POA: Diagnosis not present

## 2022-08-17 DIAGNOSIS — M1711 Unilateral primary osteoarthritis, right knee: Secondary | ICD-10-CM | POA: Diagnosis not present

## 2022-08-26 DIAGNOSIS — L570 Actinic keratosis: Secondary | ICD-10-CM | POA: Diagnosis not present

## 2022-10-14 DIAGNOSIS — E782 Mixed hyperlipidemia: Secondary | ICD-10-CM | POA: Diagnosis not present

## 2022-10-14 DIAGNOSIS — E785 Hyperlipidemia, unspecified: Secondary | ICD-10-CM | POA: Diagnosis not present

## 2022-10-14 DIAGNOSIS — E1169 Type 2 diabetes mellitus with other specified complication: Secondary | ICD-10-CM | POA: Diagnosis not present

## 2022-10-18 DIAGNOSIS — I480 Paroxysmal atrial fibrillation: Secondary | ICD-10-CM | POA: Diagnosis not present

## 2022-10-18 DIAGNOSIS — E039 Hypothyroidism, unspecified: Secondary | ICD-10-CM | POA: Diagnosis not present

## 2022-10-18 DIAGNOSIS — E782 Mixed hyperlipidemia: Secondary | ICD-10-CM | POA: Diagnosis not present

## 2022-10-18 DIAGNOSIS — I10 Essential (primary) hypertension: Secondary | ICD-10-CM | POA: Diagnosis not present

## 2022-10-18 DIAGNOSIS — E1169 Type 2 diabetes mellitus with other specified complication: Secondary | ICD-10-CM | POA: Diagnosis not present

## 2022-10-18 DIAGNOSIS — E785 Hyperlipidemia, unspecified: Secondary | ICD-10-CM | POA: Diagnosis not present

## 2022-10-18 DIAGNOSIS — N1832 Chronic kidney disease, stage 3b: Secondary | ICD-10-CM | POA: Diagnosis not present

## 2022-10-18 DIAGNOSIS — K7460 Unspecified cirrhosis of liver: Secondary | ICD-10-CM | POA: Diagnosis not present

## 2022-11-05 DIAGNOSIS — J019 Acute sinusitis, unspecified: Secondary | ICD-10-CM | POA: Diagnosis not present

## 2022-11-05 DIAGNOSIS — H1032 Unspecified acute conjunctivitis, left eye: Secondary | ICD-10-CM | POA: Diagnosis not present

## 2022-11-05 DIAGNOSIS — J42 Unspecified chronic bronchitis: Secondary | ICD-10-CM | POA: Diagnosis not present

## 2022-11-05 DIAGNOSIS — Z03818 Encounter for observation for suspected exposure to other biological agents ruled out: Secondary | ICD-10-CM | POA: Diagnosis not present

## 2023-01-10 DIAGNOSIS — E785 Hyperlipidemia, unspecified: Secondary | ICD-10-CM | POA: Diagnosis not present

## 2023-01-10 DIAGNOSIS — E039 Hypothyroidism, unspecified: Secondary | ICD-10-CM | POA: Diagnosis not present

## 2023-01-10 DIAGNOSIS — E1169 Type 2 diabetes mellitus with other specified complication: Secondary | ICD-10-CM | POA: Diagnosis not present

## 2023-01-10 DIAGNOSIS — E782 Mixed hyperlipidemia: Secondary | ICD-10-CM | POA: Diagnosis not present

## 2023-01-14 ENCOUNTER — Other Ambulatory Visit: Payer: Self-pay | Admitting: Family Medicine

## 2023-01-14 DIAGNOSIS — R911 Solitary pulmonary nodule: Secondary | ICD-10-CM

## 2023-01-17 DIAGNOSIS — E785 Hyperlipidemia, unspecified: Secondary | ICD-10-CM | POA: Diagnosis not present

## 2023-01-17 DIAGNOSIS — E782 Mixed hyperlipidemia: Secondary | ICD-10-CM | POA: Diagnosis not present

## 2023-01-17 DIAGNOSIS — I480 Paroxysmal atrial fibrillation: Secondary | ICD-10-CM | POA: Diagnosis not present

## 2023-01-17 DIAGNOSIS — I10 Essential (primary) hypertension: Secondary | ICD-10-CM | POA: Diagnosis not present

## 2023-01-17 DIAGNOSIS — E039 Hypothyroidism, unspecified: Secondary | ICD-10-CM | POA: Diagnosis not present

## 2023-01-17 DIAGNOSIS — E1169 Type 2 diabetes mellitus with other specified complication: Secondary | ICD-10-CM | POA: Diagnosis not present

## 2023-01-26 ENCOUNTER — Ambulatory Visit
Admission: RE | Admit: 2023-01-26 | Discharge: 2023-01-26 | Disposition: A | Discharge status: Home / Self Care | Payer: PPO | Source: Ambulatory Visit | Attending: Family Medicine | Admitting: Family Medicine

## 2023-01-26 DIAGNOSIS — R911 Solitary pulmonary nodule: Secondary | ICD-10-CM

## 2023-04-29 DIAGNOSIS — E039 Hypothyroidism, unspecified: Secondary | ICD-10-CM | POA: Diagnosis not present

## 2023-04-29 DIAGNOSIS — E782 Mixed hyperlipidemia: Secondary | ICD-10-CM | POA: Diagnosis not present

## 2023-04-29 DIAGNOSIS — E785 Hyperlipidemia, unspecified: Secondary | ICD-10-CM | POA: Diagnosis not present

## 2023-04-29 DIAGNOSIS — E1169 Type 2 diabetes mellitus with other specified complication: Secondary | ICD-10-CM | POA: Diagnosis not present

## 2023-05-06 DIAGNOSIS — I10 Essential (primary) hypertension: Secondary | ICD-10-CM | POA: Diagnosis not present

## 2023-05-06 DIAGNOSIS — E782 Mixed hyperlipidemia: Secondary | ICD-10-CM | POA: Diagnosis not present

## 2023-05-06 DIAGNOSIS — I480 Paroxysmal atrial fibrillation: Secondary | ICD-10-CM | POA: Diagnosis not present

## 2023-05-06 DIAGNOSIS — E039 Hypothyroidism, unspecified: Secondary | ICD-10-CM | POA: Diagnosis not present

## 2023-05-06 DIAGNOSIS — E1169 Type 2 diabetes mellitus with other specified complication: Secondary | ICD-10-CM | POA: Diagnosis not present

## 2023-05-06 DIAGNOSIS — E785 Hyperlipidemia, unspecified: Secondary | ICD-10-CM | POA: Diagnosis not present

## 2023-06-06 DIAGNOSIS — Z03818 Encounter for observation for suspected exposure to other biological agents ruled out: Secondary | ICD-10-CM | POA: Diagnosis not present

## 2023-06-06 DIAGNOSIS — R509 Fever, unspecified: Secondary | ICD-10-CM | POA: Diagnosis not present

## 2023-06-06 DIAGNOSIS — B9689 Other specified bacterial agents as the cause of diseases classified elsewhere: Secondary | ICD-10-CM | POA: Diagnosis not present

## 2023-06-06 DIAGNOSIS — J019 Acute sinusitis, unspecified: Secondary | ICD-10-CM | POA: Diagnosis not present

## 2023-06-06 DIAGNOSIS — J209 Acute bronchitis, unspecified: Secondary | ICD-10-CM | POA: Diagnosis not present

## 2023-06-10 ENCOUNTER — Ambulatory Visit: Payer: PPO | Admitting: Nurse Practitioner

## 2023-06-12 NOTE — Progress Notes (Unsigned)
Cardiology Office Note  Date:  06/13/2023   ID:  Abigail Harris, Abigail Harris Nov 02, 1955, MRN [id number]  PCP:  Marisue Ivan, MD   Chief Complaint  Patient presents with   12 month follow up     "Doing well." Medications reviewed by the patient verbally.     HPI:  Abigail Harris is a 67 year old woman with past medical history of Atrial fibrillation, initial dx in 2013 in [place] Bradycardia on meds Hypertension  Hypothyroidism  Home atypical chest pain Asthma, chronic SOB Mild to moderate OSA, non on CPAP Previously followed by cardiology at [facility] last seen September 2021 Who presents for follow-up of her paroxysmal atrial fibrillation, chest pain  Last seen by myself in clinic July 2023  Rare afib, less than a minute to 15 min, resolves without intervention Rare tingling on left chest/skin, chest wall into neck at times Feels like a "popping" on skin  Sick in Sept, required coarse of prednisone for shortness of breath  Presenting to the emergency room Feb 10, 2022 with chest pain, burning , popping  Chronic SOB, followed by pulmonary ADLS and sits to recover Can't do stairs and hills  Lab work reviewed A1C 6.8 Total chol 200, LDL 98  Afib in 2013, in hospital Used to be on atenolol: This was held for unclear reasons Several episodes since then  EKG personally reviewed by myself on todays visit EKG Interpretation Date/Time:  Monday June 13 2023 08:15:36 EDT Ventricular Rate:  60 PR Interval:  158 QRS Duration:  86 QT Interval:  428 QTC Calculation: 428 R Axis:   12  Text Interpretation: Normal sinus rhythm Nonspecific ST abnormality When compared with ECG of 09-Feb-2022 22:19, No significant change was found Confirmed by Julien Nordmann [phone]) on 06/13/2023 8:29:05 AM   Echo 2021: NORMAL LEFT VENTRICULAR SYSTOLIC FUNCTION  NORMAL RIGHT VENTRICULAR SYSTOLIC FUNCTION  TRIVIAL REGURGITATION NOTED (See above)  NO VALVULAR STENOSIS  POOR SOUND  TRANSMISSION  DEFINITY USED TO ENHANCE ENDOCARDIAL BORDERS    PMH:   has a past medical history of A-fib (HCC), Bradycardia, Hypertension, and Type II diabetes mellitus (HCC).  PSH:    Past Surgical History:  Procedure Laterality Date   CESAREAN SECTION      Current Outpatient Medications  Medication Sig Dispense Refill   acetaminophen (TYLENOL) 500 MG tablet Take 1 tablet (500 mg total) by mouth every 6 (six) hours as needed. 30 tablet 0   amLODipine (NORVASC) 5 MG tablet Take 5 mg by mouth daily.     amoxicillin-clavulanate (AUGMENTIN) 875-125 MG tablet Take 1 tablet by mouth 2 (two) times daily.     Cholecalciferol 25 MCG (1000 UT) capsule Take 1,000 Units by mouth daily.     ELIQUIS 5 MG TABS tablet Take 5 mg by mouth 2 (two) times daily.     ezetimibe (ZETIA) 10 MG tablet Take 10 mg by mouth daily.     levothyroxine (SYNTHROID) 112 MCG tablet Take 112 mcg by mouth 2 (two) times a week. Saturday & Sunday     levothyroxine (SYNTHROID) 125 MCG tablet Take 125 mcg by mouth daily before breakfast. Monday - Friday     losartan-hydrochlorothiazide (HYZAAR) 100-12.5 MG tablet Take 1 tablet by mouth daily.     pantoprazole (PROTONIX) 20 MG tablet Take 20 mg by mouth daily.     propranolol (INDERAL) 20 MG tablet Take 1 tablet (20 mg total) by mouth daily as needed (a fib). 30 tablet 3   rosuvastatin (CRESTOR) 5 MG  tablet Take 5 mg by mouth daily.     vitamin B-12 (CYANOCOBALAMIN) 1000 MCG tablet Take 1,000 mcg by mouth daily.     metFORMIN (GLUCOPHAGE-XR) 500 MG 24 hr tablet Take 500 mg by mouth daily. (Patient not taking: Reported on 03/31/2022)     methocarbamol (ROBAXIN) 500 MG tablet Take 1 tablet (500 mg total) by mouth every 8 (eight) hours as needed for muscle spasms. (Patient not taking: Reported on 03/31/2022) 15 tablet 0   TRELEGY ELLIPTA 200-62.5-25 MCG/ACT AEPB Inhale 1 puff into the lungs daily. (Patient not taking: Reported on 03/31/2022)     No current facility-administered  medications for this visit.    Allergies:   Lovastatin, Methocarbamol, and Milk (cow)   Social History:  The patient  reports that she has never smoked. She has never used smokeless tobacco. She reports current alcohol use. She reports that she does not use drugs.   Family History:   family history includes AAA (abdominal aortic aneurysm) in her brother; Arrhythmia in her mother; Cancer in her father and maternal grandfather; Diabetes type II in her brother; Heart attack in her maternal grandmother; Heart attack (age of onset: 25) in her father; Heart attack (age of onset: 93) in her brother; Hyperlipidemia in her mother; Hypertension in her mother.    Review of Systems: Review of Systems  Constitutional: Negative.   HENT: Negative.    Respiratory: Negative.    Cardiovascular: Negative.   Gastrointestinal: Negative.   Musculoskeletal: Negative.   Neurological: Negative.   Psychiatric/Behavioral: Negative.    All other systems reviewed and are negative.   PHYSICAL EXAM: VS:  BP 120/78 (BP Location: Left Arm, Patient Position: Sitting, Cuff Size: Normal)   Pulse 60   Ht 5\' 5"  (1.651 m)   Wt 214 lb 8 oz (97.3 kg)   SpO2 98%   BMI 35.69 kg/m  , BMI Body mass index is 35.69 kg/m. Constitutional:  oriented to person, place, and time. No distress.  HENT:  Head: Grossly normal Eyes:  no discharge. No scleral icterus.  Neck: No JVD, no carotid bruits  Cardiovascular: Regular rate and rhythm, no murmurs appreciated Pulmonary/Chest: Clear to auscultation bilaterally, no wheezes or rails Abdominal: Soft.  no distension.  no tenderness.  Musculoskeletal: Normal range of motion Neurological:  normal muscle tone. Coordination normal. No atrophy Skin: Skin warm and dry Psychiatric: normal affect, pleasant,3   Recent Labs: No results found for requested labs within last 365 days.    Lipid Panel No results found for: "CHOL", "HDL", "LDLCALC", "TRIG"    Wt Readings from Last 3  Encounters:  06/13/23 214 lb 8 oz (97.3 kg)  03/31/22 214 lb (97.1 kg)  02/09/22 211 lb (95.7 kg)      ASSESSMENT AND PLAN:  Problem List Items Addressed This Visit   None Visit Diagnoses     Paroxysmal atrial fibrillation (HCC)    -  Primary   Relevant Medications   amLODipine (NORVASC) 5 MG tablet   rosuvastatin (CRESTOR) 5 MG tablet   Other Relevant Orders   EKG 12-Lead (Completed)   Essential hypertension       Relevant Medications   amLODipine (NORVASC) 5 MG tablet   rosuvastatin (CRESTOR) 5 MG tablet   Other Relevant Orders   EKG 12-Lead (Completed)   Mixed hyperlipidemia       Relevant Medications   amLODipine (NORVASC) 5 MG tablet   rosuvastatin (CRESTOR) 5 MG tablet   Shortness of breath  Relevant Orders   EKG 12-Lead (Completed)   OSA (obstructive sleep apnea)           Paroxysmal atrial fibrillation Rare short episodes, on no rate or rhythm control agents recently secondary to bradycardia New propranolol 10 to 20 mg as needed  for breakthrough atrial fibrillation episodes Continue Eliquis 5 twice daily Sleep apnea likely a trigger, did not tolerate CPAP  Essential hypertension Blood pressure is well controlled on today's visit. No changes made to the medications.  Hyperlipidemia On Zetia,  statin intolerance, cholesterol trending higher  Shortness of breath, chronic history of asthma, currently not on Trelegy too expensive Is not using her inhaler as needed Recent course of steroids for shortness of breath in the setting of viral URI Recommend regular exercise program for conditioning   Total encounter time more than 30 minutes  Greater than 50% was spent in counseling and coordination of care with the patient    Signed, Dossie Arbour, M.D., Ph.D. [facility] [place], [place] [phone]

## 2023-06-13 ENCOUNTER — Encounter: Payer: Self-pay | Admitting: Cardiovascular Disease

## 2023-06-13 ENCOUNTER — Ambulatory Visit: Payer: PPO | Attending: Nurse Practitioner | Admitting: Cardiovascular Disease

## 2023-06-13 VITALS — BP 120/78 | HR 60 | Ht 65.0 in | Wt 214.5 lb

## 2023-06-13 DIAGNOSIS — I10 Essential (primary) hypertension: Secondary | ICD-10-CM

## 2023-06-13 DIAGNOSIS — I480 Paroxysmal atrial fibrillation: Secondary | ICD-10-CM

## 2023-06-13 DIAGNOSIS — E782 Mixed hyperlipidemia: Secondary | ICD-10-CM | POA: Diagnosis not present

## 2023-06-13 DIAGNOSIS — G4733 Obstructive sleep apnea (adult) (pediatric): Secondary | ICD-10-CM | POA: Diagnosis not present

## 2023-06-13 DIAGNOSIS — R0602 Shortness of breath: Secondary | ICD-10-CM

## 2023-06-13 NOTE — Patient Instructions (Signed)

## 2023-07-02 DIAGNOSIS — R053 Chronic cough: Secondary | ICD-10-CM | POA: Diagnosis not present

## 2023-07-02 DIAGNOSIS — J42 Unspecified chronic bronchitis: Secondary | ICD-10-CM | POA: Diagnosis not present

## 2023-07-02 DIAGNOSIS — U071 COVID-19: Secondary | ICD-10-CM | POA: Diagnosis not present

## 2023-07-25 DIAGNOSIS — Z872 Personal history of diseases of the skin and subcutaneous tissue: Secondary | ICD-10-CM | POA: Diagnosis not present

## 2023-07-25 DIAGNOSIS — L578 Other skin changes due to chronic exposure to nonionizing radiation: Secondary | ICD-10-CM | POA: Diagnosis not present

## 2023-07-25 DIAGNOSIS — D492 Neoplasm of unspecified behavior of bone, soft tissue, and skin: Secondary | ICD-10-CM | POA: Diagnosis not present

## 2023-07-29 DIAGNOSIS — E785 Hyperlipidemia, unspecified: Secondary | ICD-10-CM | POA: Diagnosis not present

## 2023-07-29 DIAGNOSIS — E782 Mixed hyperlipidemia: Secondary | ICD-10-CM | POA: Diagnosis not present

## 2023-07-29 DIAGNOSIS — E1169 Type 2 diabetes mellitus with other specified complication: Secondary | ICD-10-CM | POA: Diagnosis not present

## 2023-07-29 DIAGNOSIS — E039 Hypothyroidism, unspecified: Secondary | ICD-10-CM | POA: Diagnosis not present

## 2023-08-10 DIAGNOSIS — Z0000 Encounter for general adult medical examination without abnormal findings: Secondary | ICD-10-CM | POA: Diagnosis not present

## 2023-08-10 DIAGNOSIS — E785 Hyperlipidemia, unspecified: Secondary | ICD-10-CM | POA: Diagnosis not present

## 2023-08-10 DIAGNOSIS — E782 Mixed hyperlipidemia: Secondary | ICD-10-CM | POA: Diagnosis not present

## 2023-08-10 DIAGNOSIS — I10 Essential (primary) hypertension: Secondary | ICD-10-CM | POA: Diagnosis not present

## 2023-08-10 DIAGNOSIS — N289 Disorder of kidney and ureter, unspecified: Secondary | ICD-10-CM | POA: Diagnosis not present

## 2023-08-10 DIAGNOSIS — E1169 Type 2 diabetes mellitus with other specified complication: Secondary | ICD-10-CM | POA: Diagnosis not present

## 2023-08-10 DIAGNOSIS — E039 Hypothyroidism, unspecified: Secondary | ICD-10-CM | POA: Diagnosis not present

## 2023-09-13 DIAGNOSIS — E039 Hypothyroidism, unspecified: Secondary | ICD-10-CM | POA: Diagnosis not present

## 2023-09-14 IMAGING — CR DG CHEST 2V
1 series · 2 of 2 positions shown · non-contrast
Comparison: None Available.

CLINICAL DATA: Chest pain

EXAM:
CHEST - 2 VIEW

[Series 1: dg chest 2 view · 0.14mm/px · 2 of 2 slices shown]
[im 1/2]
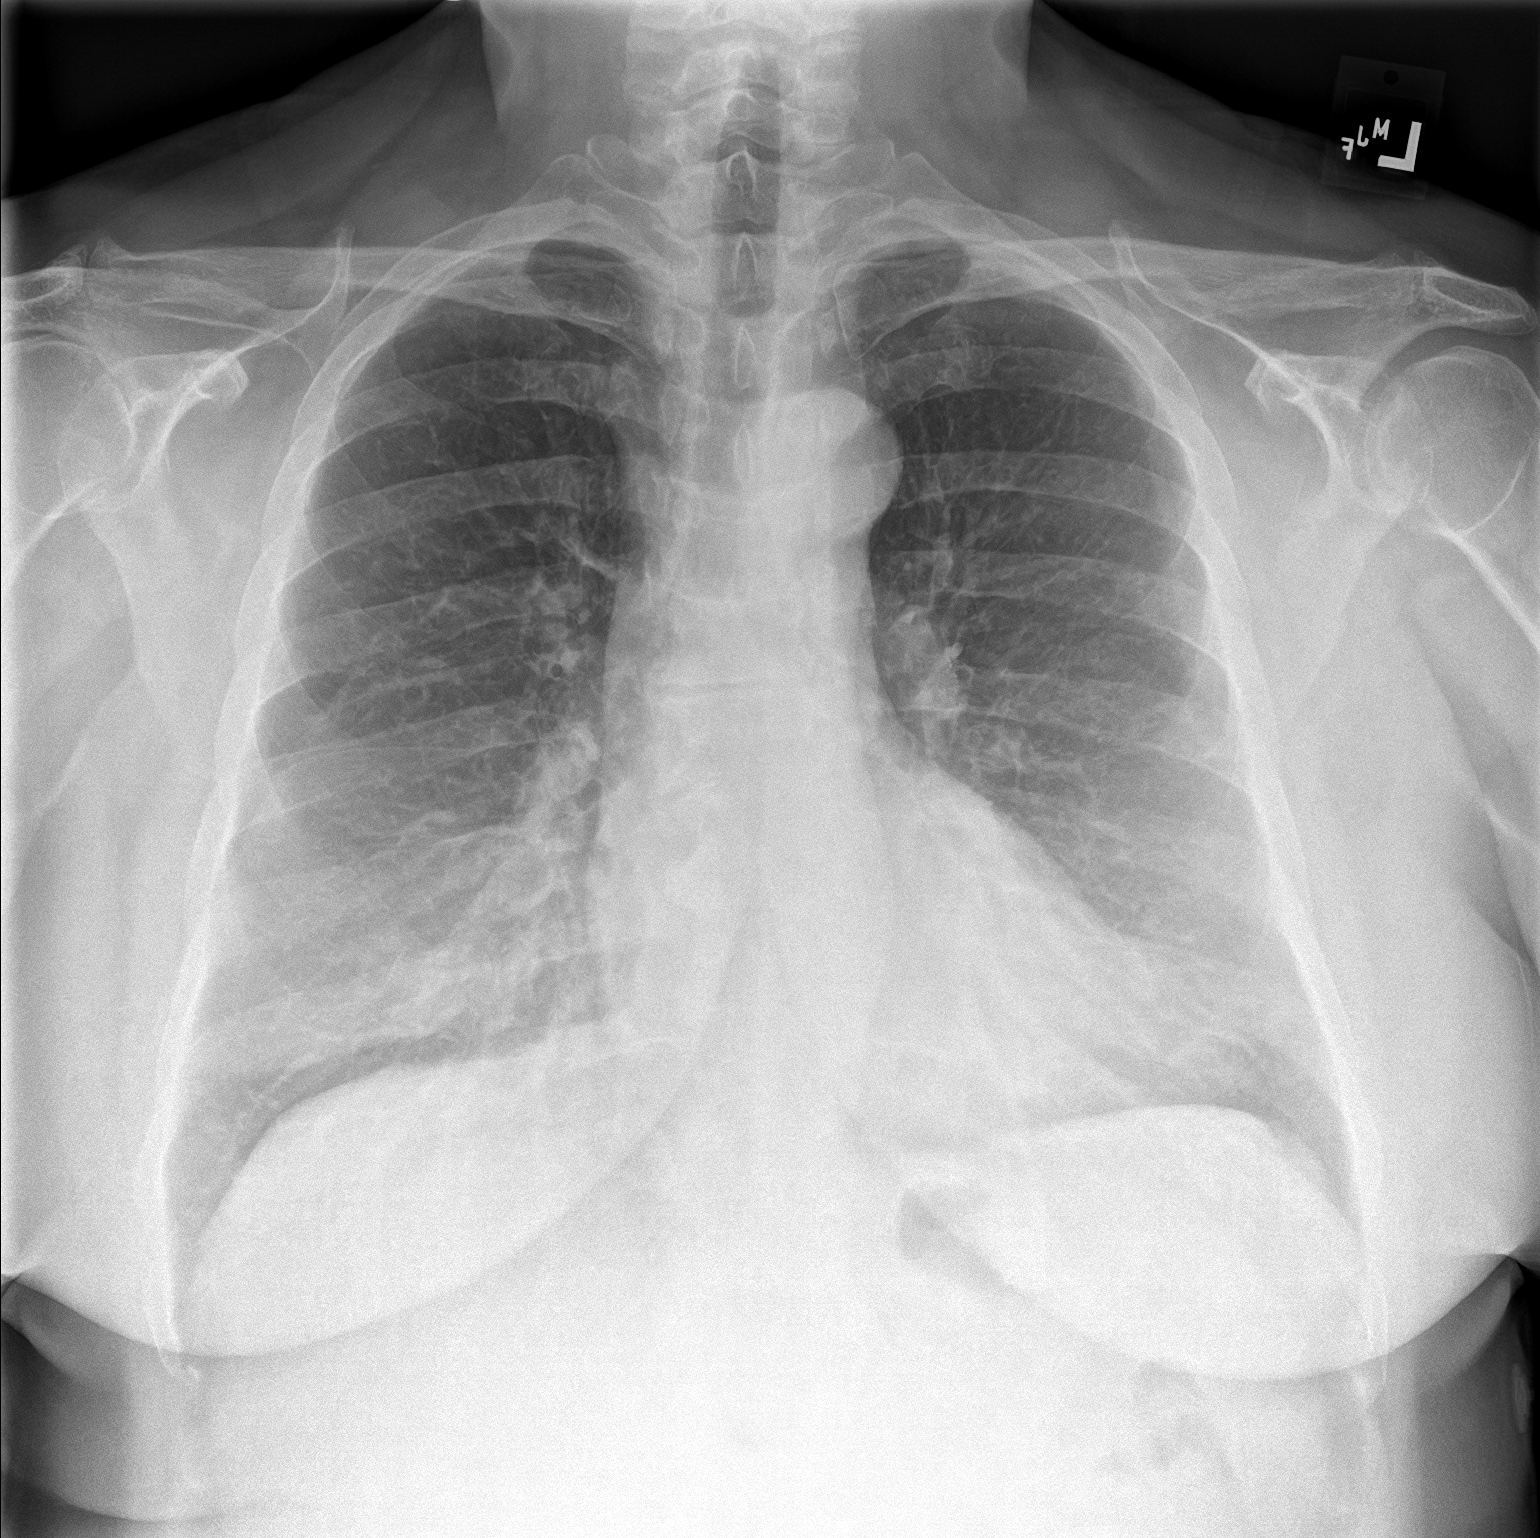
[im 2/2]
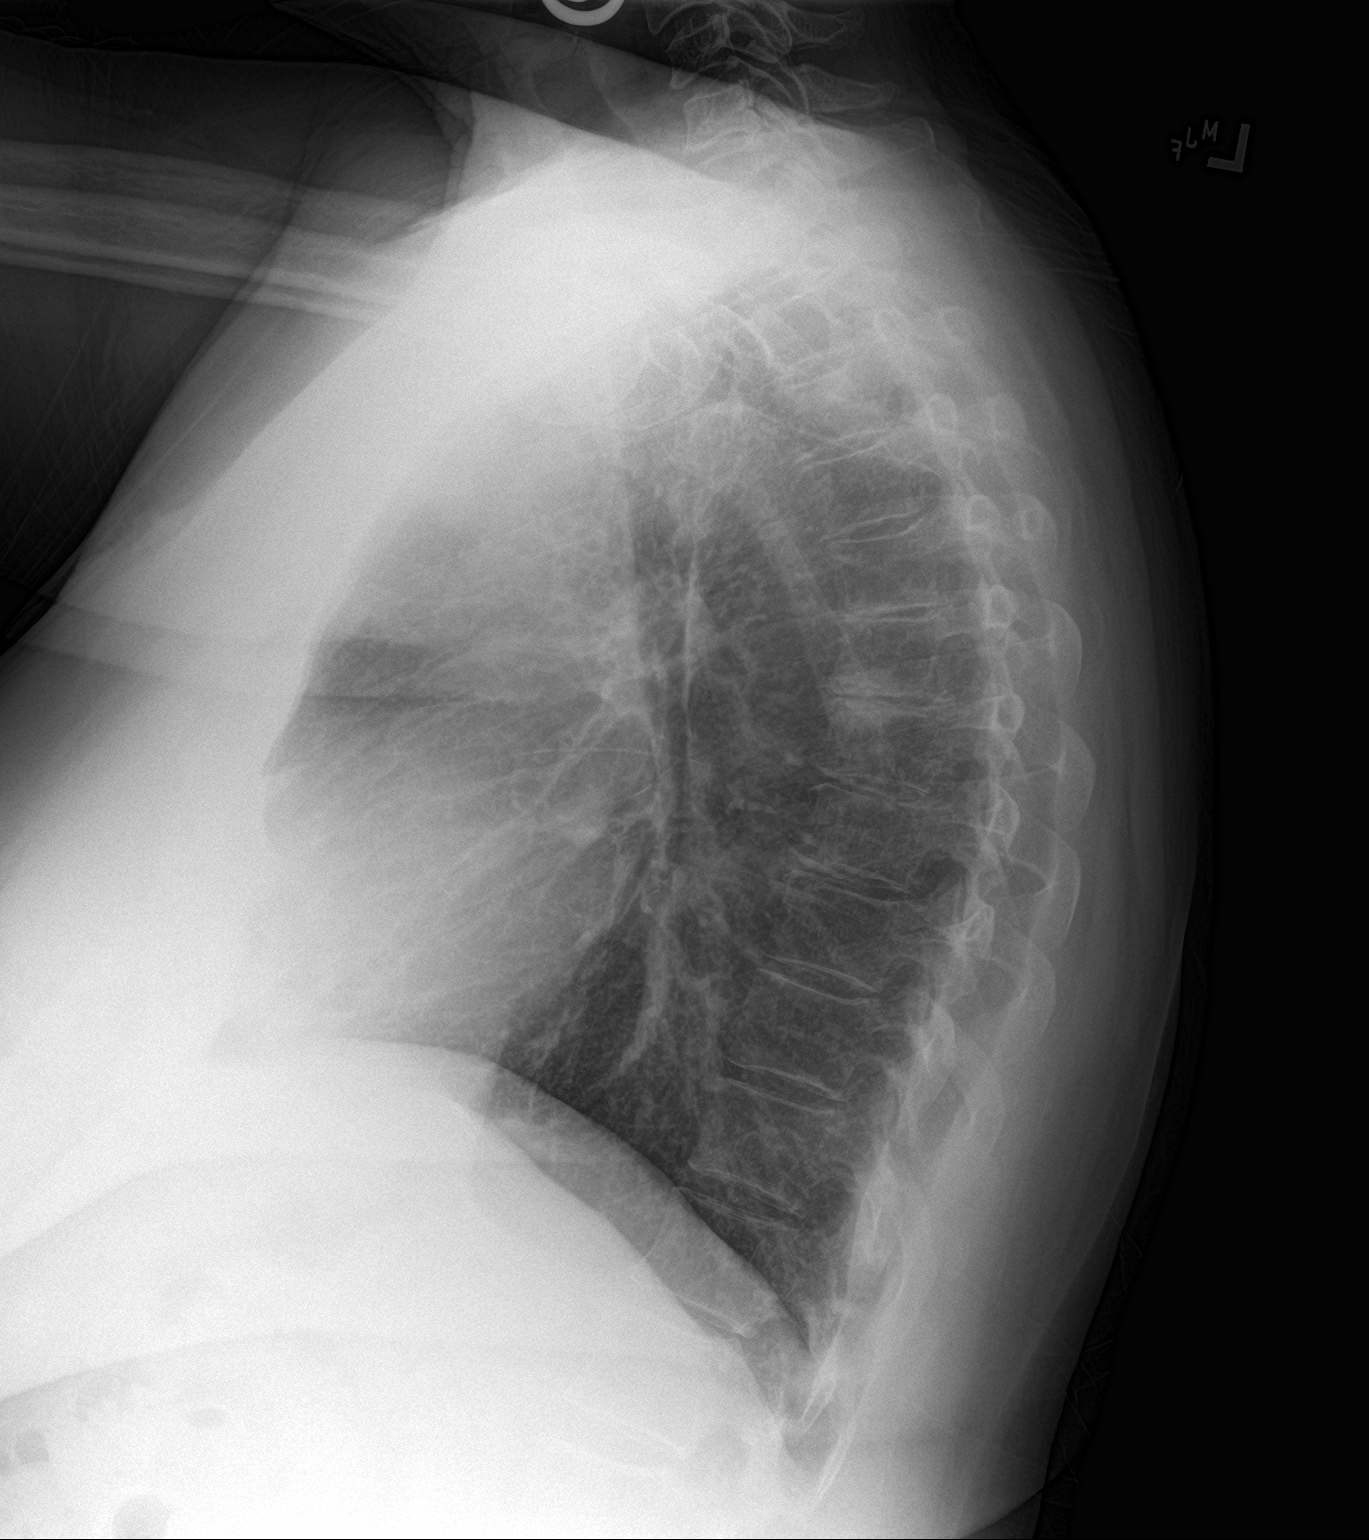

[2 of 2 positions shown; findings below may reference images not displayed]

FINDINGS: Lungs are clear.  No pleural effusion or pneumothorax.

The heart is normal in size.

Visualized osseous structures are within normal limits.
IMPRESSION: Normal chest radiographs.

## 2023-09-15 IMAGING — CT CT ANGIO CHEST
3 of 7 series · 17 of 46 positions shown · IV contrast (agent unspecified)
Comparison: PA and lateral chest from yesterday with CT abdomen and
pelvis with IV contrast available dated 09/06/2020

CLINICAL DATA: Left chest pain with acute aortic syndrome
suspected.

EXAM:
CT ANGIOGRAPHY CHEST WITH CONTRAST
TECHNIQUE: Chest CT without contrast was initially performed axially.

[Series 6: arterial · axial · arterial · 0.78mm/px · z∈[-754,-416]mm · 12 of 201 slices shown]
[im 16/201  lung]
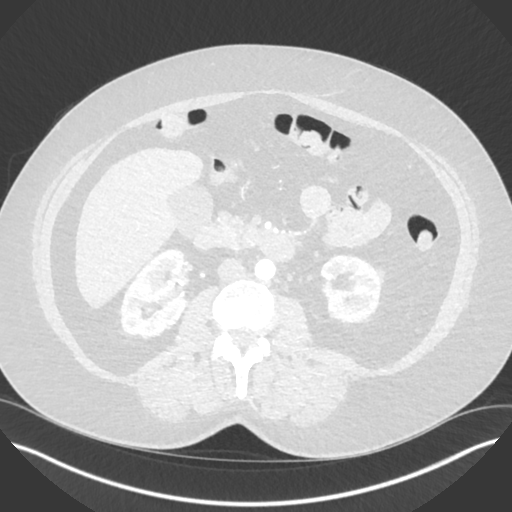
[im 31/201  soft-tissue]
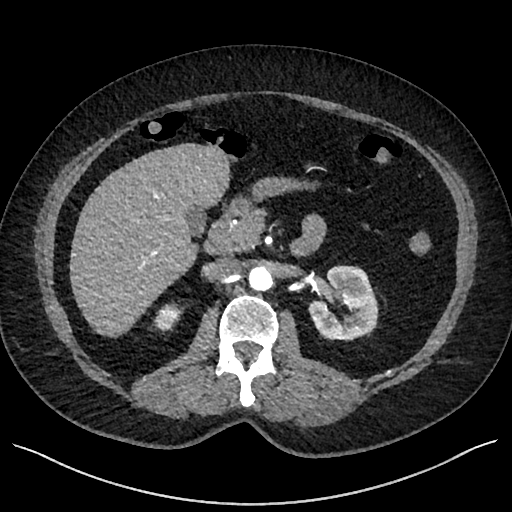
[im 47/201  lung]
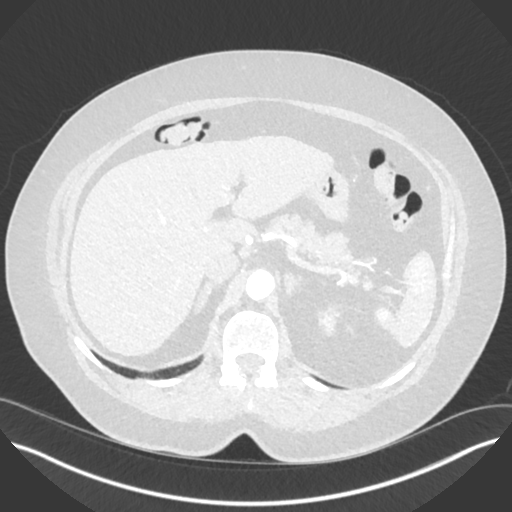
[im 62/201  soft-tissue]
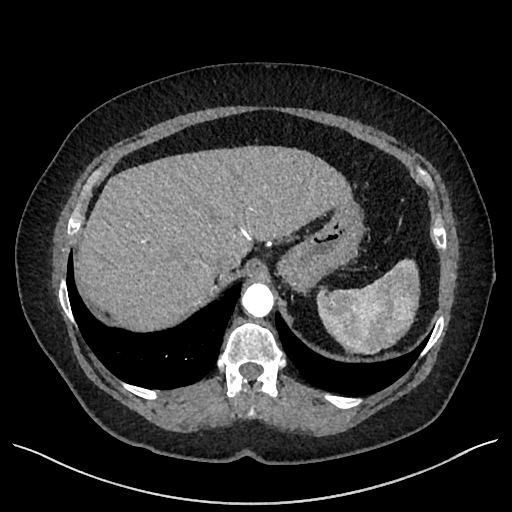
[im 77/201  lung]
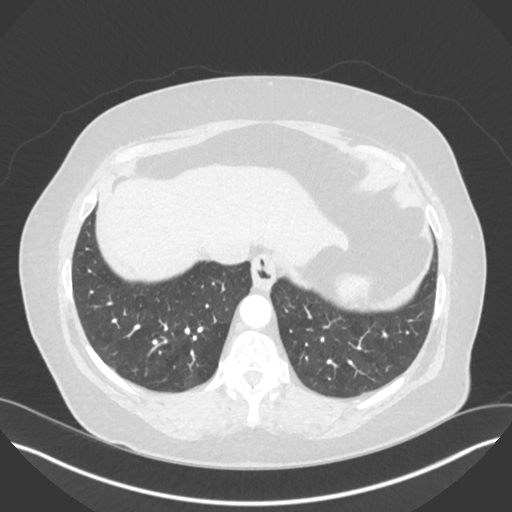
[im 93/201  soft-tissue]
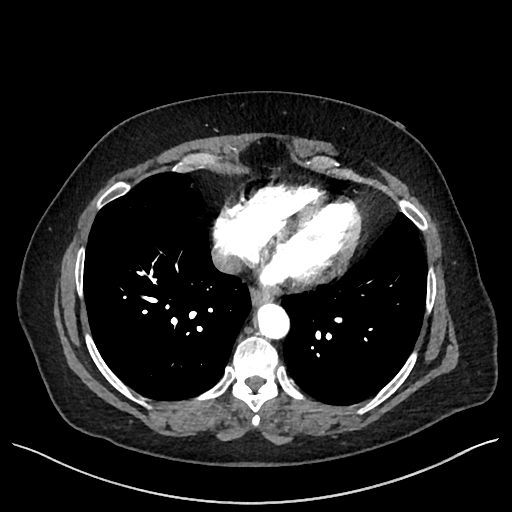
[im 108/201  lung]
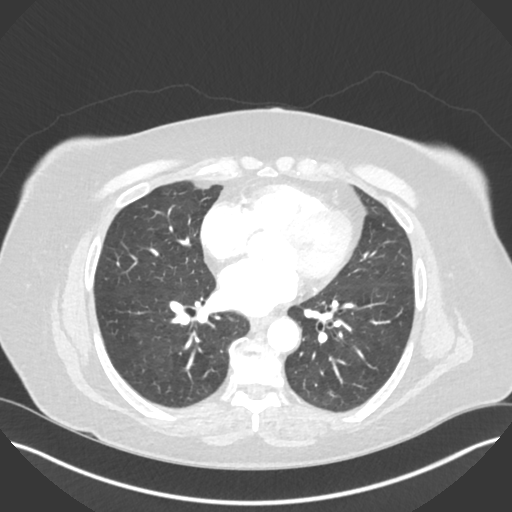
[im 124/201  soft-tissue]
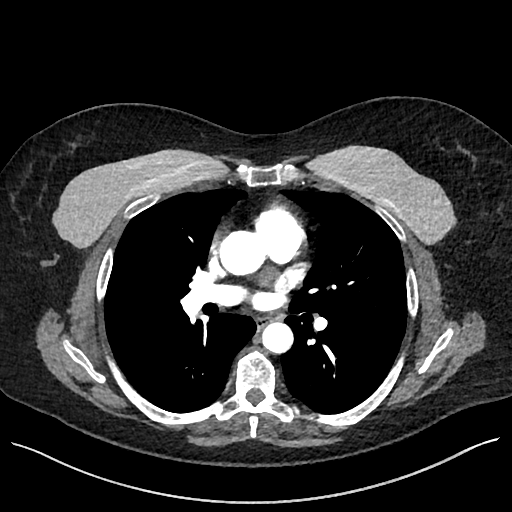
[im 139/201  lung]
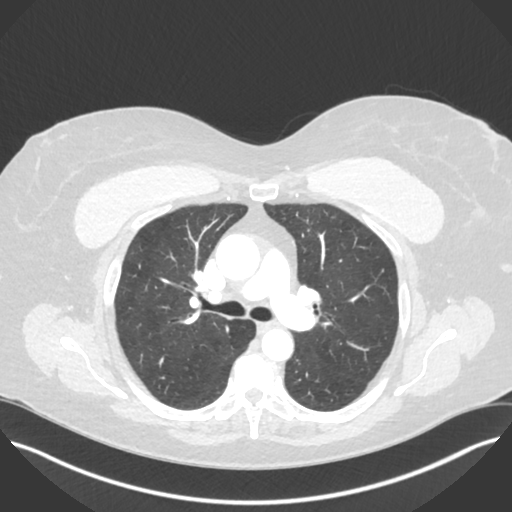
[im 154/201  soft-tissue]
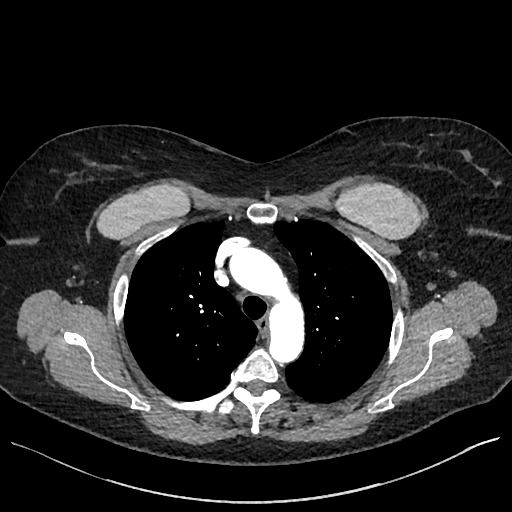
[im 170/201  lung]
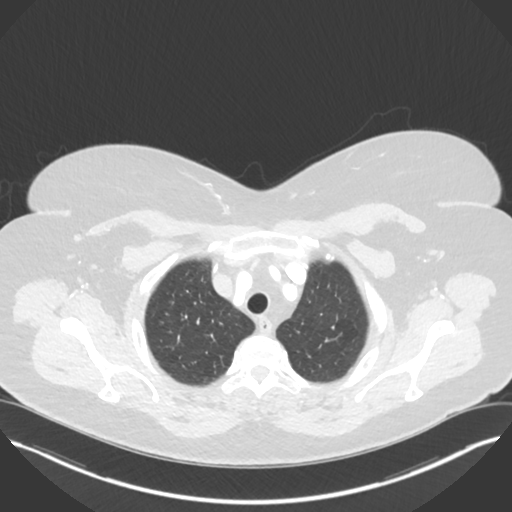
[im 185/201  soft-tissue]
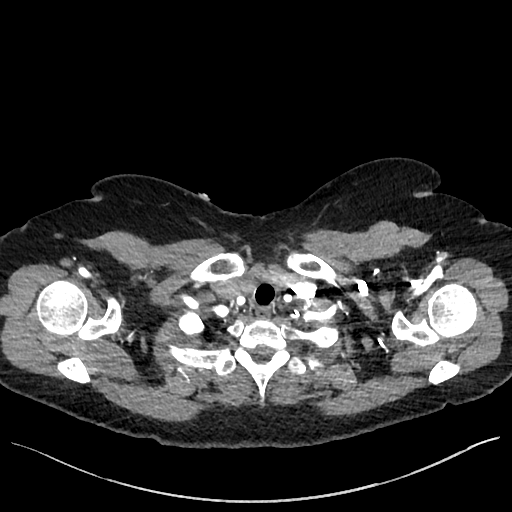

[Series 7: lung · axial · 0.78mm/px · z∈[-754,-692]mm · 2 of 201 slices shown]
[im 16/201  soft-tissue]
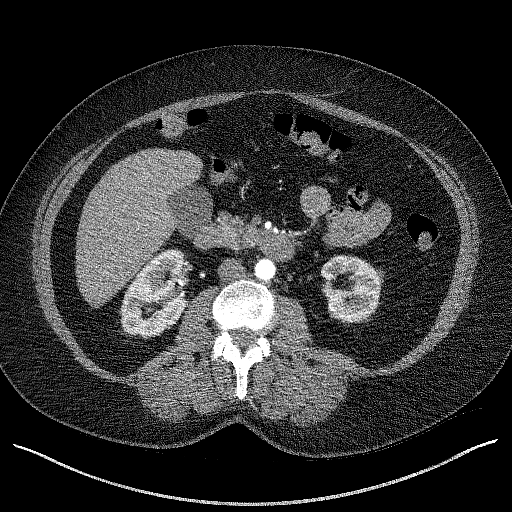
[im 47/201  soft-tissue]
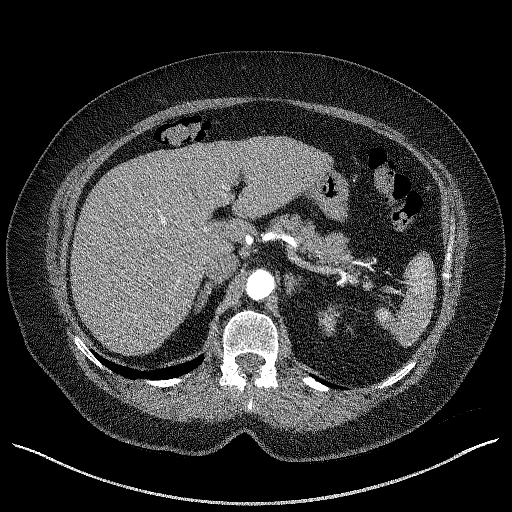

[Series 9: cor · coronal · 0.82mm/px · 3 of 137 slices shown]
[im 35/137  soft-tissue]
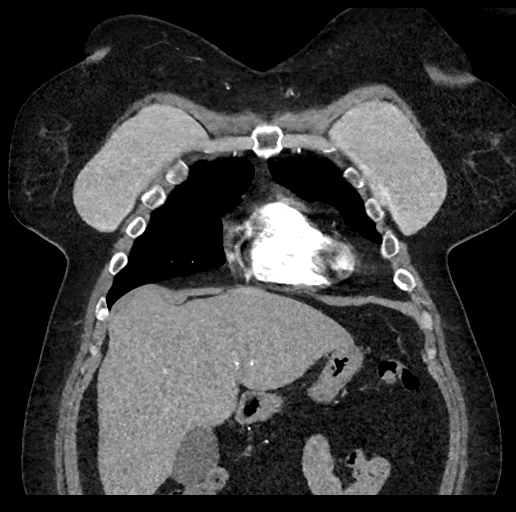
[im 69/137  soft-tissue]
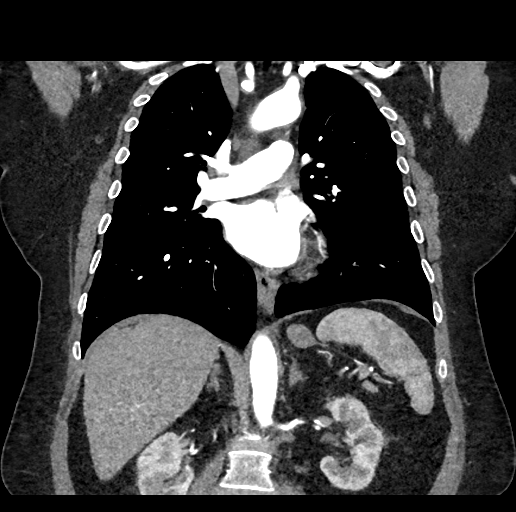
[im 103/137  soft-tissue]
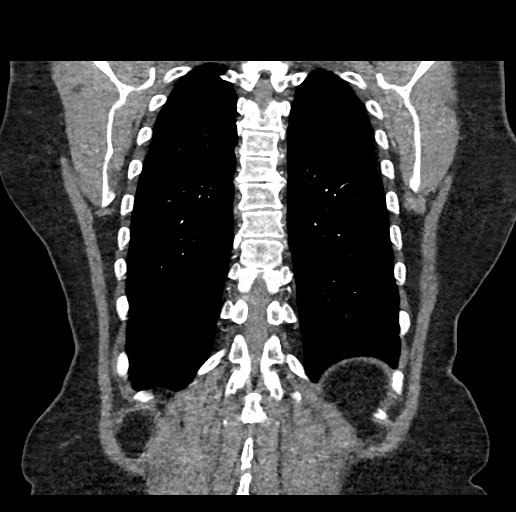

[17 of 46 positions shown; findings below may reference images not displayed]

Multidetector CT imaging of the chest was performed using the
standard protocol during bolus administration of intravenous
contrast. Multiplanar CT image reconstructions and MIPs were
obtained to evaluate the vascular anatomy.

RADIATION DOSE REDUCTION: This exam was performed according to the
departmental dose-optimization program which includes automated
exposure control, adjustment of the mA and/or kV according to
patient size and/or use of iterative reconstruction technique.

CONTRAST:  100mL OMNIPAQUE IOHEXOL 350 MG/ML SOLN
FINDINGS: Cardiovascular: The cardiac size is normal. There are trace
calcifications in the coronary arteries. There is no pericardial
effusion. Pulmonary veins are decompressed.

The pulmonary arteries are normal in caliber without evidence of
arterial embolus.

There is mild atherosclerosis in the distal aortic arch. There is
tortuosity in the descending segment. The great vessels are widely
patent.

There is no aortic hematoma, penetrating ulcer, aneurysm, dissection
or stenosis.

Mediastinum/Nodes: No enlarged mediastinal, hilar, or axillary lymph
nodes. Thyroid gland, trachea, and esophagus demonstrate no
significant findings. There is a small hiatal hernia.

Lungs/Pleura: There is no pleural effusion, thickening or
pneumothorax. There are few linear scar-like opacities in both
bases. Mild air trapping is again noted in the lower lobes.

There is reticular scarring at the lung apices. There is a 3 mm
noncalcified nodule in the left upper lobe anteriorly on [DATE]. there
is a 4 mm noncalcified right middle lobe fissural nodule on 7: 86.
Remainder of the lungs clear.

Upper Abdomen: The liver is mildly steatotic. Gallbladder and bile
ducts are unremarkable. Capsular nodularity to has developed along
the left hepatic lobe consistent with cirrhosis but there is no
splenomegaly or dilatation in the hepatic portal main vein. There
are mild calcifications in the visualized abdominal aorta.

Musculoskeletal: No focal chest wall abnormality. There are
bilateral breast implants. There is degenerative disc disease and
spondylosis in the midthoracic spine. Slight thoracic
dextroscoliosis.

Review of the MIP images confirms the above findings.
IMPRESSION: 1. No evidence of acute aortic syndrome.
2. Mild atherosclerosis in the distal aortic arch and upper
abdominal aorta. No penetrating ulcerative plaque.
3. No evidence of arterial dilatation or emboli and no findings of
acute right heart strain.
4. Trace calcification in the coronary arteries.
5. Air trapping in the lower lobes consistent with small airways
disease. No focal pneumonia.
6. 3 mm and 4 mm left upper and right middle lobe nodules. If
patient is low risk for malignancy, no routine follow-up imaging is
recommended; if patient is high risk for malignancy, a non-contrast
Chest CT at 12 months is optional. If performed and the nodule is
stable at 12 months, no further follow-up is recommended. These
guidelines do not apply to immunocompromised patients and patients
with cancer. Follow up in patients with significant comorbidities as
clinically warranted. For lung cancer screening, adhere to Lung-RADS
guidelines. Reference: Radiology. 5283; 284(1):228-43.
7. Interval development of capsular nodularity of the liver in the
left lobe. Findings consistent with cirrhosis. No splenomegaly is
seen or portal vein dilatation.
8. Small hiatal hernia.

## 2023-10-28 DIAGNOSIS — J069 Acute upper respiratory infection, unspecified: Secondary | ICD-10-CM | POA: Diagnosis not present

## 2023-10-28 DIAGNOSIS — Z8709 Personal history of other diseases of the respiratory system: Secondary | ICD-10-CM | POA: Diagnosis not present

## 2023-10-28 DIAGNOSIS — Z03818 Encounter for observation for suspected exposure to other biological agents ruled out: Secondary | ICD-10-CM | POA: Diagnosis not present

## 2023-11-14 DIAGNOSIS — Z03818 Encounter for observation for suspected exposure to other biological agents ruled out: Secondary | ICD-10-CM | POA: Diagnosis not present

## 2023-11-14 DIAGNOSIS — J069 Acute upper respiratory infection, unspecified: Secondary | ICD-10-CM | POA: Diagnosis not present

## 2024-01-11 ENCOUNTER — Other Ambulatory Visit: Payer: Self-pay | Admitting: Family Medicine

## 2024-01-11 DIAGNOSIS — R911 Solitary pulmonary nodule: Secondary | ICD-10-CM

## 2024-01-24 ENCOUNTER — Ambulatory Visit
Admission: RE | Admit: 2024-01-24 | Discharge: 2024-01-24 | Disposition: A | Discharge status: Home / Self Care | Source: Ambulatory Visit | Attending: Family Medicine | Admitting: Family Medicine

## 2024-01-24 DIAGNOSIS — R918 Other nonspecific abnormal finding of lung field: Secondary | ICD-10-CM | POA: Diagnosis not present

## 2024-01-24 DIAGNOSIS — R911 Solitary pulmonary nodule: Secondary | ICD-10-CM | POA: Insufficient documentation

## 2024-01-24 DIAGNOSIS — I700 Atherosclerosis of aorta: Secondary | ICD-10-CM | POA: Diagnosis not present

## 2024-01-31 DIAGNOSIS — I10 Essential (primary) hypertension: Secondary | ICD-10-CM | POA: Diagnosis not present

## 2024-01-31 DIAGNOSIS — E1169 Type 2 diabetes mellitus with other specified complication: Secondary | ICD-10-CM | POA: Diagnosis not present

## 2024-01-31 DIAGNOSIS — E039 Hypothyroidism, unspecified: Secondary | ICD-10-CM | POA: Diagnosis not present

## 2024-01-31 DIAGNOSIS — E782 Mixed hyperlipidemia: Secondary | ICD-10-CM | POA: Diagnosis not present

## 2024-01-31 DIAGNOSIS — E785 Hyperlipidemia, unspecified: Secondary | ICD-10-CM | POA: Diagnosis not present

## 2024-02-03 DIAGNOSIS — J329 Chronic sinusitis, unspecified: Secondary | ICD-10-CM | POA: Diagnosis not present

## 2024-02-03 DIAGNOSIS — I10 Essential (primary) hypertension: Secondary | ICD-10-CM | POA: Diagnosis not present

## 2024-02-03 DIAGNOSIS — E782 Mixed hyperlipidemia: Secondary | ICD-10-CM | POA: Diagnosis not present

## 2024-02-03 DIAGNOSIS — I480 Paroxysmal atrial fibrillation: Secondary | ICD-10-CM | POA: Diagnosis not present

## 2024-02-03 DIAGNOSIS — K7460 Unspecified cirrhosis of liver: Secondary | ICD-10-CM | POA: Diagnosis not present

## 2024-02-03 DIAGNOSIS — E785 Hyperlipidemia, unspecified: Secondary | ICD-10-CM | POA: Diagnosis not present

## 2024-02-03 DIAGNOSIS — E1169 Type 2 diabetes mellitus with other specified complication: Secondary | ICD-10-CM | POA: Diagnosis not present

## 2024-02-04 DIAGNOSIS — Z03818 Encounter for observation for suspected exposure to other biological agents ruled out: Secondary | ICD-10-CM | POA: Diagnosis not present

## 2024-02-04 DIAGNOSIS — B9789 Other viral agents as the cause of diseases classified elsewhere: Secondary | ICD-10-CM | POA: Diagnosis not present

## 2024-02-04 DIAGNOSIS — R062 Wheezing: Secondary | ICD-10-CM | POA: Diagnosis not present

## 2024-02-04 DIAGNOSIS — R051 Acute cough: Secondary | ICD-10-CM | POA: Diagnosis not present

## 2024-02-04 DIAGNOSIS — J988 Other specified respiratory disorders: Secondary | ICD-10-CM | POA: Diagnosis not present

## 2024-03-07 DIAGNOSIS — Z03818 Encounter for observation for suspected exposure to other biological agents ruled out: Secondary | ICD-10-CM | POA: Diagnosis not present

## 2024-03-07 DIAGNOSIS — J209 Acute bronchitis, unspecified: Secondary | ICD-10-CM | POA: Diagnosis not present

## 2024-03-07 DIAGNOSIS — J029 Acute pharyngitis, unspecified: Secondary | ICD-10-CM | POA: Diagnosis not present

## 2024-03-07 DIAGNOSIS — J302 Other seasonal allergic rhinitis: Secondary | ICD-10-CM | POA: Diagnosis not present

## 2024-03-07 DIAGNOSIS — J329 Chronic sinusitis, unspecified: Secondary | ICD-10-CM | POA: Diagnosis not present

## 2024-03-07 NOTE — Progress Notes (Signed)
 Subjective:    Chief Complaint  Patient presents with  . Cough    X 3 days  C/o productive cough, nasal congestion and ST - dysphagia  Reports fever 99-100s for the past 3 days, @2am  was running 100.9 and took ibuprofen, no more meds since  Reports SOB - baseline due to lost lung function. Not worse with sxs.  No CP Reports loose stools. Denies n/v   . Nasal Congestion  . Fever  . Otalgia    R otalgia, fullness and itching     History was provided by the patient and EMR. Abigail Harris is a 68 y.o., female  who presents with 3-day history of worsening sinus congestion, sore throat, chest congestion, cough, wheeze, and fever.  Patient reports she is experiencing sinus infections, and complications thereof, every 3 to 4 weeks.  Reports had seen ENT in the past-requesting referral.  Has seen pulmonology in the past-diagnosed with bronchiectasia's, feels most of her symptoms are stemming from her sinuses.  No complaints of dizziness; no chest pain or increased shortness of breath; no nausea, vomiting, diarrhea.  Reports appetite intact. Symptoms include: above   Patient denies: above Treatment to date: above   Past Medical History:  Diagnosis Date  . A-fib (CMS/HHS-HCC)   . Abdominal hernia   . Arrhythmia 09/2011   AFib  . 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 Bronchitis, chronic (CMS/HHS-HCC) Childhood  . Heart murmur 1989   Rheumatic Fever  . History of pneumonia 2019, 2020, 2021  . Hyperlipidemia 2013  . Hypertension 2013   AFib  . Hypothyroidism   . Renal insufficiency (Cr 1.1 and GFR 50 - 01/23/21) 01/28/2021  . Sinusitis, unspecified Sinus Infection in past  . Sleep apnea 2013   Mild to moderate   The following portions of the patient's history were reviewed and updated as appropriate: allergies, current medications, past social history, and problem list.  Review of Systems A complete review of systems was performed.  Positive and pertinent negative responses are documented in  the HPI, and all other systems are negative.   Objective:     Vitals:   03/07/24 0812  BP: 134/76  Pulse: 80  Temp: 37.2 C (98.9 F)  TempSrc: Oral  SpO2: 94%  Weight: 96.6 kg (213 lb)  Height: 165.1 cm (5' 5)  PainSc: 0-No pain    General Appearance:  Well-developed, well-nourished.  Pleasant and cooperative.  No acute distress.  Occasional croupy cough. HEENT:  [place]/AT, PERRLA, EOMI, sclera clear, conjunctivae pink.   Mouth and throat: Posterior oropharynx mild erythema, drainage; tongue midline. TM's: Mild erythema bilaterally.  Neck:  Supple, no JVD or adenopathy. Cardiovascular:  Regular rate and rhythm.  Lungs: Mildly diminished, slightly coarse with fair effort. Abdomen:  Soft, nontender, nondistended. Normoactive bowel sounds.  Back: No CVA tenderness. Extremities:   No cyanosis, clubbing, or edema. Neuro:  Alert and orient x4; nonfocal.    Lab/X-ray/Treatments:   RST-negative Extended resp viral panel-negative       Assessment:      1. Recurrent sinusitis -     amoxicillin-clavulanate (AUGMENTIN) 875-125 mg tablet; Take 1 tablet (875 mg total) by mouth 2 (two) times daily for 14 days  Dispense: 28 tablet; Refill: 0 -     predniSONE (DELTASONE) 10 mg tablet pack; Take by mouth once daily for 12 days .6-6-5-5-4-4-3-3-2-2-1-1  Dispense: 42 tablet; Refill: 0 -     Ambulatory Referral to Otolaryngology  2. Bronchitis with bronchospasm, unspecified -  predniSONE (DELTASONE) 10 mg tablet pack; Take by mouth once daily for 12 days .6-6-5-5-4-4-3-3-2-2-1-1  Dispense: 42 tablet; Refill: 0 -     promethazine-dextromethorphan (PROMETHAZINE-DM) 6.25-15 mg/5 mL syrup; Take 5 mLs by mouth every 6 (six) hours as needed for Cough for up to 7 days  Dispense: 60 mL; Refill: 0  3. Seasonal allergies  4. Encounter for observation for suspected exposure to other biological agents ruled out -     Extended Respiratory Viral Panel - [facility]; Future  5. Sore throat,  unspecified -     Xpert Dx Strep A, PCR - [facility]   Augmentin for 14 days, guaifenesin, 12-day prednisone Dosepak, Phenergan DM.  Fluids and rest.  Will refer to ENT at patient's request.  Work note provided.  Call return if any further problems or concerns.    Plan:   Requested Prescriptions   Signed Prescriptions Disp Refills  . amoxicillin-clavulanate (AUGMENTIN) 875-125 mg tablet 28 tablet 0    Sig: Take 1 tablet (875 mg total) by mouth 2 (two) times daily for 14 days  . predniSONE (DELTASONE) 10 mg tablet pack 42 tablet 0    Sig: Take by mouth once daily for 12 days .6-6-5-5-4-4-3-3-2-2-1-1  . promethazine-dextromethorphan (PROMETHAZINE-DM) 6.25-15 mg/5 mL syrup 60 mL 0    Sig: Take 5 mLs by mouth every 6 (six) hours as needed for Cough for up to 7 days

## 2024-04-13 DIAGNOSIS — R221 Localized swelling, mass and lump, neck: Secondary | ICD-10-CM | POA: Diagnosis not present

## 2024-04-13 DIAGNOSIS — H6981 Other specified disorders of Eustachian tube, right ear: Secondary | ICD-10-CM | POA: Diagnosis not present

## 2024-04-13 DIAGNOSIS — J310 Chronic rhinitis: Secondary | ICD-10-CM | POA: Diagnosis not present

## 2024-04-16 DIAGNOSIS — J301 Allergic rhinitis due to pollen: Secondary | ICD-10-CM | POA: Diagnosis not present

## 2024-04-25 DIAGNOSIS — H6011 Cellulitis of right external ear: Secondary | ICD-10-CM | POA: Diagnosis not present

## 2024-04-25 DIAGNOSIS — H60501 Unspecified acute noninfective otitis externa, right ear: Secondary | ICD-10-CM | POA: Diagnosis not present

## 2024-05-05 DIAGNOSIS — Z03818 Encounter for observation for suspected exposure to other biological agents ruled out: Secondary | ICD-10-CM | POA: Diagnosis not present

## 2024-05-05 DIAGNOSIS — R0981 Nasal congestion: Secondary | ICD-10-CM | POA: Diagnosis not present

## 2024-05-05 DIAGNOSIS — R062 Wheezing: Secondary | ICD-10-CM | POA: Diagnosis not present

## 2024-08-13 DIAGNOSIS — I10 Essential (primary) hypertension: Secondary | ICD-10-CM | POA: Diagnosis not present

## 2024-08-13 DIAGNOSIS — E782 Mixed hyperlipidemia: Secondary | ICD-10-CM | POA: Diagnosis not present

## 2024-08-13 DIAGNOSIS — Z1331 Encounter for screening for depression: Secondary | ICD-10-CM | POA: Diagnosis not present

## 2024-08-13 DIAGNOSIS — E1169 Type 2 diabetes mellitus with other specified complication: Secondary | ICD-10-CM | POA: Diagnosis not present

## 2024-08-13 DIAGNOSIS — E039 Hypothyroidism, unspecified: Secondary | ICD-10-CM | POA: Diagnosis not present

## 2024-08-13 DIAGNOSIS — I480 Paroxysmal atrial fibrillation: Secondary | ICD-10-CM | POA: Diagnosis not present

## 2024-08-13 DIAGNOSIS — E785 Hyperlipidemia, unspecified: Secondary | ICD-10-CM | POA: Diagnosis not present

## 2024-08-13 DIAGNOSIS — Z0000 Encounter for general adult medical examination without abnormal findings: Secondary | ICD-10-CM | POA: Diagnosis not present

## 2024-08-21 DIAGNOSIS — M47812 Spondylosis without myelopathy or radiculopathy, cervical region: Secondary | ICD-10-CM | POA: Diagnosis not present

## 2024-08-21 DIAGNOSIS — M7918 Myalgia, other site: Secondary | ICD-10-CM | POA: Diagnosis not present

## 2024-08-21 DIAGNOSIS — M542 Cervicalgia: Secondary | ICD-10-CM | POA: Diagnosis not present
